# Patient Record
Sex: Female | Born: 1990 | Hispanic: Yes | Marital: Single | Smoking: Never smoker
Health system: Southern US, Community
[De-identification: ages and names within clinical notes are randomized; demographics above are authoritative.]

## PROBLEM LIST (undated history)

## (undated) HISTORY — PX: APPENDECTOMY: SHX54

---

## 2011-10-13 ENCOUNTER — Other Ambulatory Visit: Payer: Self-pay

## 2011-11-10 ENCOUNTER — Other Ambulatory Visit: Payer: Self-pay | Admitting: Obstetrics and Gynecology

## 2011-11-17 ENCOUNTER — Other Ambulatory Visit (HOSPITAL_COMMUNITY): Payer: Self-pay | Admitting: Obstetrics and Gynecology

## 2011-11-17 DIAGNOSIS — O280 Abnormal hematological finding on antenatal screening of mother: Secondary | ICD-10-CM

## 2011-11-23 ENCOUNTER — Ambulatory Visit (HOSPITAL_COMMUNITY)
Admission: RE | Admit: 2011-11-23 | Discharge: 2011-11-23 | Disposition: A | Discharge status: Home / Self Care | Payer: Medicaid Other | Source: Ambulatory Visit | Attending: Obstetrics and Gynecology | Admitting: Obstetrics and Gynecology

## 2011-11-23 ENCOUNTER — Encounter (HOSPITAL_COMMUNITY): Payer: Self-pay

## 2011-11-23 VITALS — BP 110/68 | HR 61 | Wt 158.0 lb

## 2011-11-23 DIAGNOSIS — Z1389 Encounter for screening for other disorder: Secondary | ICD-10-CM | POA: Insufficient documentation

## 2011-11-23 DIAGNOSIS — O289 Unspecified abnormal findings on antenatal screening of mother: Secondary | ICD-10-CM | POA: Insufficient documentation

## 2011-11-23 DIAGNOSIS — O358XX0 Maternal care for other (suspected) fetal abnormality and damage, not applicable or unspecified: Secondary | ICD-10-CM | POA: Insufficient documentation

## 2011-11-23 DIAGNOSIS — O280 Abnormal hematological finding on antenatal screening of mother: Secondary | ICD-10-CM

## 2011-11-23 DIAGNOSIS — Z363 Encounter for antenatal screening for malformations: Secondary | ICD-10-CM | POA: Insufficient documentation

## 2011-11-23 NOTE — Progress Notes (Signed)
Obstetric ultrasound performed today.  Please see report in [facility]. 

## 2011-11-23 NOTE — Progress Notes (Signed)
Genetic Counseling  High-Risk Gestation Note  Appointment Date:  11/23/2011 Referred By: [name], DO Date of Birth:  1991/04/25 Partner:  Sabrina Harrison   Pregnancy History: [id number] Estimated Date of Delivery: 05/02/12 Estimated Gestational Age: [redacted]w[redacted]d   [name] and her partner, Mr. Sabrina Harrison, were seen for genetic counseling because of an increased risk for fetal Down syndrome based on Quad screening through [facility]. The patient provided her own interpreter today.  They were counseled regarding the Quad screen result and the associated 1 in 265 risk for fetal Down syndrome.  We reviewed chromosomes, nondisjunction, and the common features and variable prognosis of Down syndrome.  In addition, we reviewed the screen adjusted reduction in risks for trisomy 18 and ONTDs.  We also discussed other explanations for a screen positive result including: a gestational dating error, differences in maternal metabolism, and normal variation.  We reviewed other available screening and diagnostic options including detailed ultrasound, cell free fetal DNA (cffDNA) testing, and amniocentesis.  We discussed the risks, limitations, and benefits of each.  Specifically, we discussed that cffDNA testing utilizes fetal DNA found in the maternal circulation. This test is not diagnostic for chromosome conditions, but can provide information regarding the presence or absence of extra fetal DNA for chromosomes 13, 18 and 21. The reported detection rate is greater than 99% for Trisomy 21, greater than 97% for Trisomy 18, and is approximately 91% for Trisomy 13. The false positive rate is thought to be less than 1% for any of these conditions. After consideration of all the options, and a clear understanding of the newness and limitations of cffDNA, they elected to proceed with cell free fetal DNA testing. Those results will be available in 8-10 days and will be forwarded to your office when we  receive them. Mrs. Sabrina Harrison will consider the option of amniocentesis pending the results of this screening test.  In addition, a complete ultrasound was performed today.  The ultrasound report will be sent under separate cover.  Ms. Sabrina Harrison was provided with written information regarding sickle cell anemia (SCA) including the carrier frequency and incidence in the Hispanic population, the availability of carrier testing and prenatal diagnosis if indicated.  In addition, we discussed that hemoglobinopathies are routinely screened for as part of the [place] newborn screening panel.  She declined hemoglobin electrophoresis today.   Both family histories were reviewed and found to be noncontributory for birth defects, mental retardation, and known genetic conditions. Without further information regarding the provided family history, an accurate genetic risk cannot be calculated. Further genetic counseling is warranted if more information is obtained.  Ms. Sabrina Harrison denied exposure to environmental toxins or chemical agents. She denied the use of alcohol, tobacco or street drugs. She denied significant viral illnesses during the course of her pregnancy. Her medical and surgical histories were noncontributory.   I counseled this couple for approximately 38 minutes regarding the above risks and available options.     [name], MS,  Patent attorney

## 2011-11-25 ENCOUNTER — Other Ambulatory Visit: Payer: Self-pay

## 2012-01-04 ENCOUNTER — Ambulatory Visit (HOSPITAL_COMMUNITY): Payer: Medicaid Other | Attending: Obstetrics and Gynecology

## 2013-09-27 IMAGING — US US OB DETAIL+14 WK
1 series · 14 of 28 positions shown · non-contrast
Comparison: none

[Series 1: us ob detail+14 wk · 0.20mm/px · 14 of 64 slices shown]
[im 3/64]
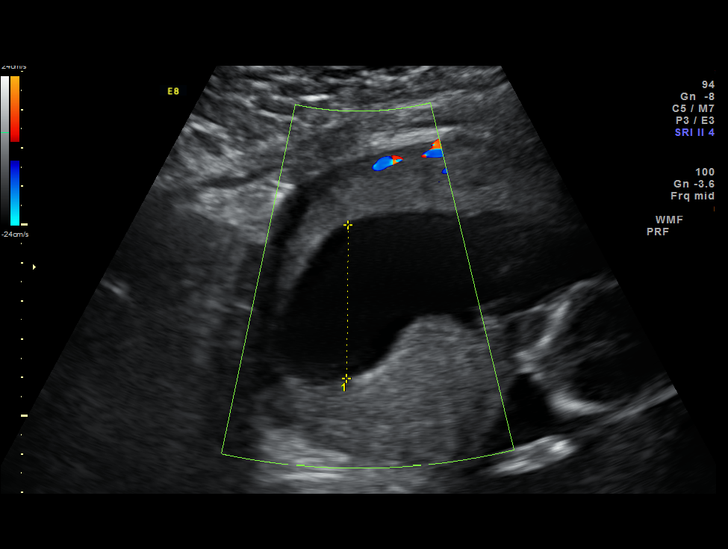
[im 8/64]
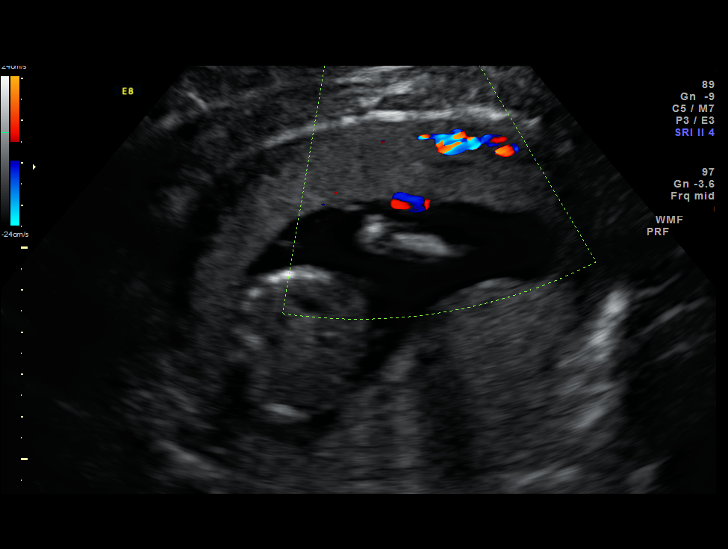
[im 12/64]
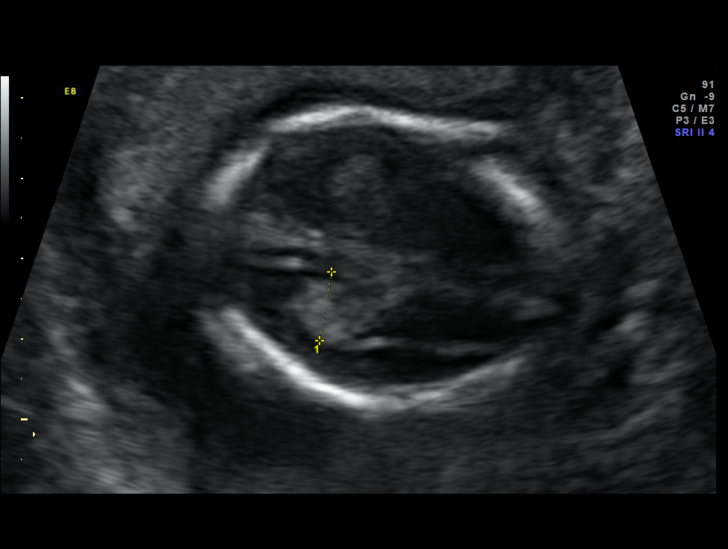
[im 17/64]
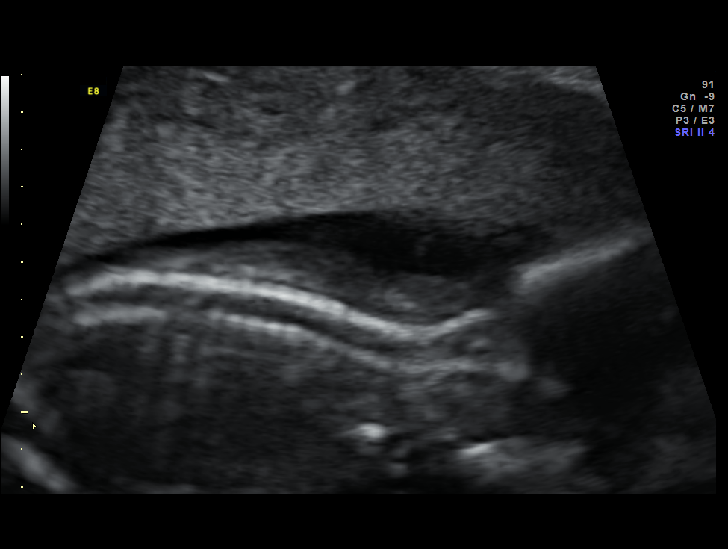
[im 22/64]
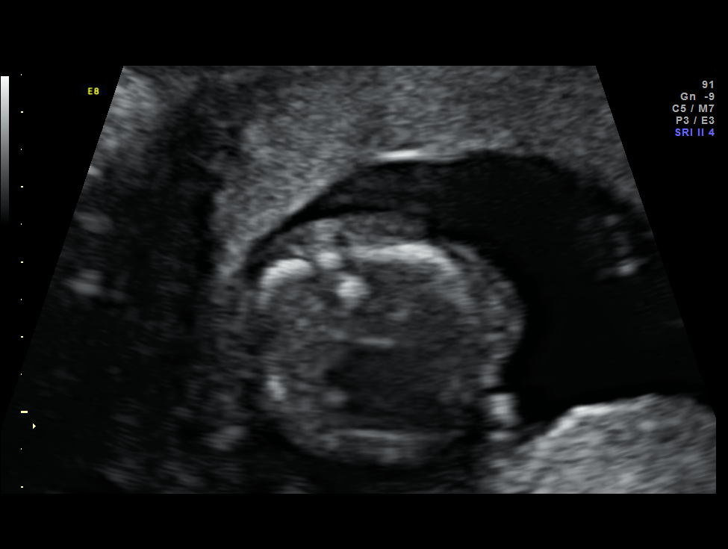
[im 26/64]
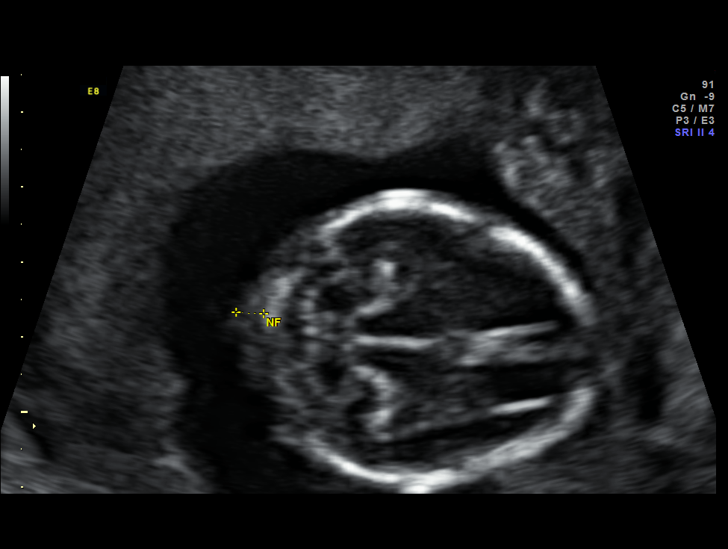
[im 31/64]
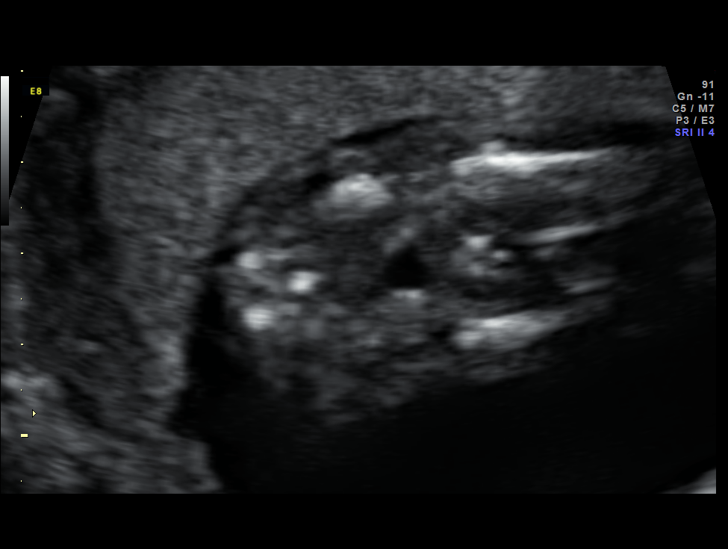
[im 36/64]
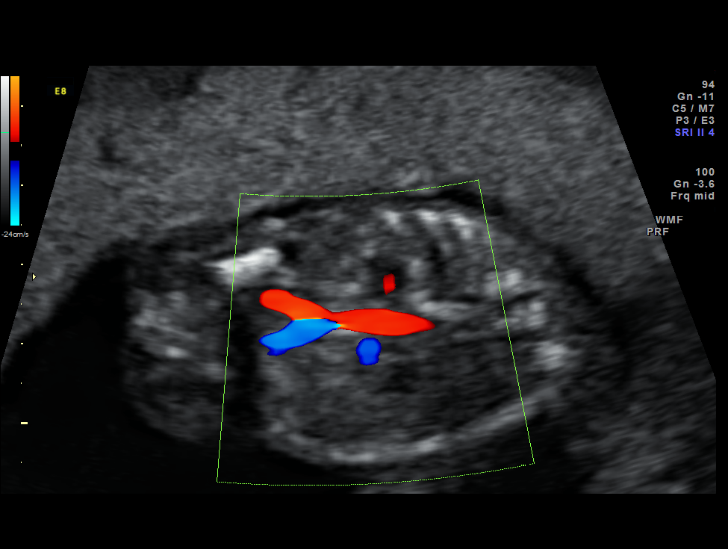
[im 40/64]
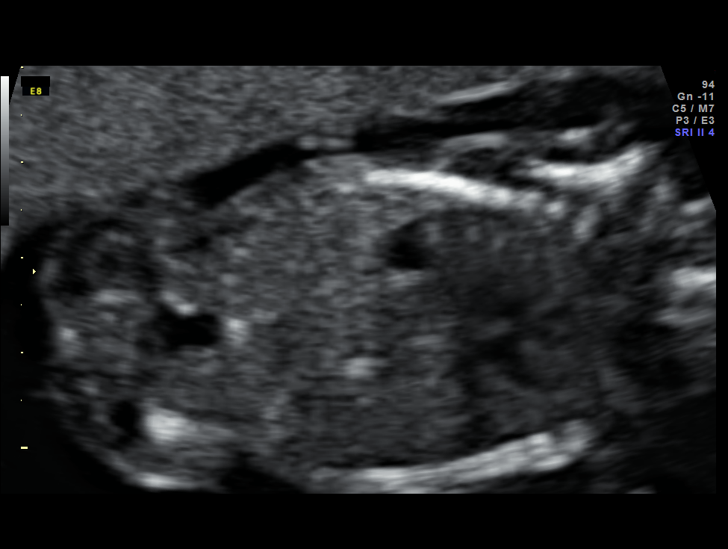
[im 45/64]
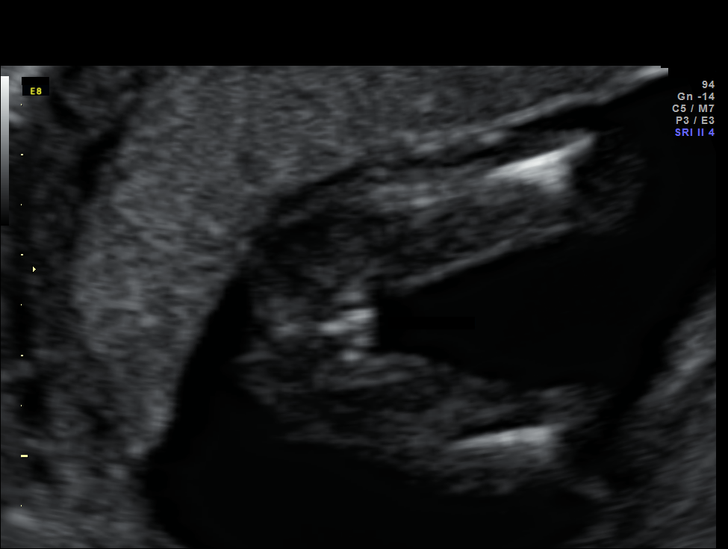
[im 50/64]
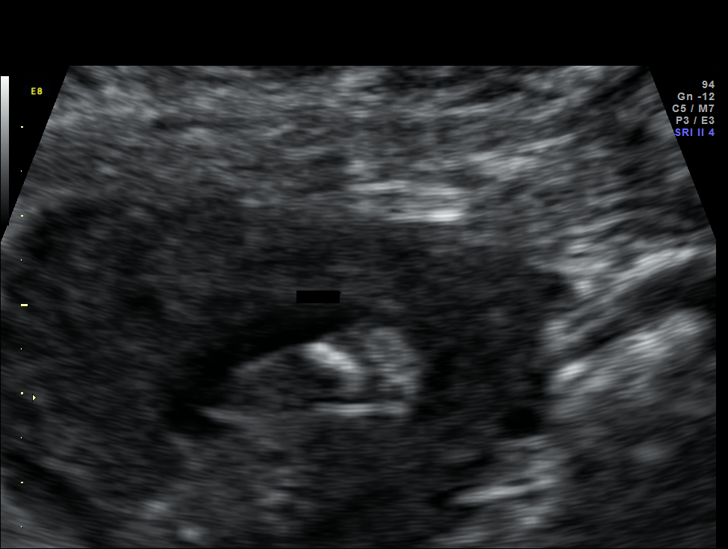
[im 54/64]
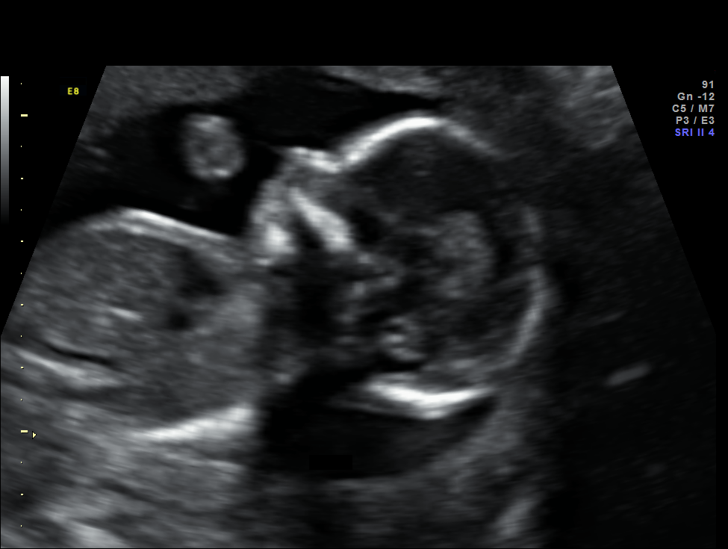
[im 59/64]
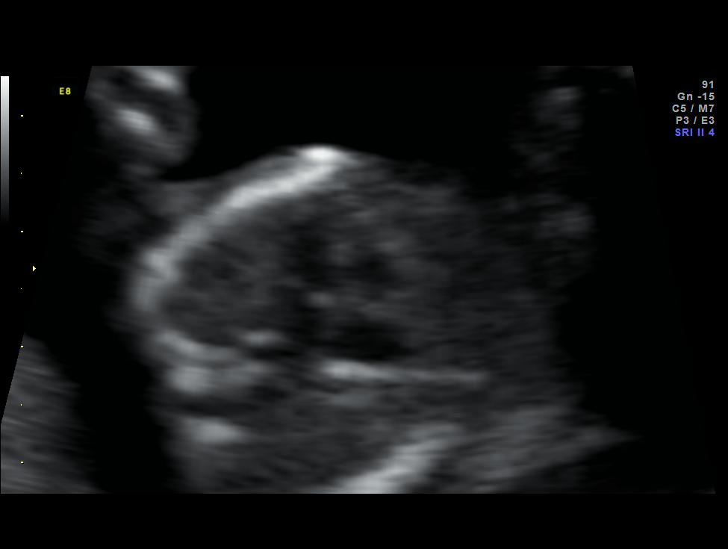
[im 64/64]
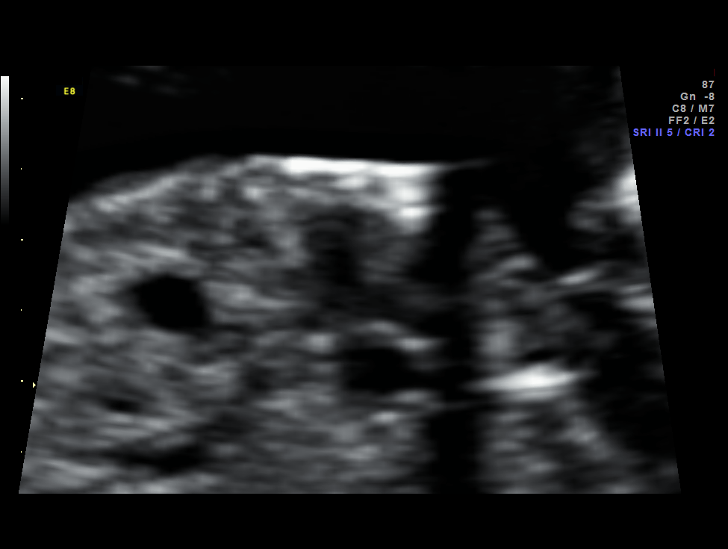

[14 of 28 positions shown; findings below may reference images not displayed]

Canned report from images found in remote index.

Refer to host system for actual result text.

## 2014-09-15 ENCOUNTER — Encounter (HOSPITAL_COMMUNITY): Payer: Self-pay

## 2016-11-16 DIAGNOSIS — I83892 Varicose veins of left lower extremities with other complications: Secondary | ICD-10-CM | POA: Diagnosis not present

## 2016-11-16 DIAGNOSIS — I83812 Varicose veins of left lower extremities with pain: Secondary | ICD-10-CM | POA: Diagnosis not present

## 2016-11-22 DIAGNOSIS — I83812 Varicose veins of left lower extremities with pain: Secondary | ICD-10-CM | POA: Diagnosis not present

## 2016-12-13 DIAGNOSIS — I8312 Varicose veins of left lower extremity with inflammation: Secondary | ICD-10-CM | POA: Diagnosis not present

## 2016-12-13 DIAGNOSIS — I83892 Varicose veins of left lower extremities with other complications: Secondary | ICD-10-CM | POA: Diagnosis not present

## 2016-12-26 DIAGNOSIS — I83812 Varicose veins of left lower extremities with pain: Secondary | ICD-10-CM | POA: Diagnosis not present

## 2016-12-26 DIAGNOSIS — M7981 Nontraumatic hematoma of soft tissue: Secondary | ICD-10-CM | POA: Diagnosis not present

## 2016-12-26 DIAGNOSIS — I83892 Varicose veins of left lower extremities with other complications: Secondary | ICD-10-CM | POA: Diagnosis not present

## 2017-01-16 DIAGNOSIS — I83892 Varicose veins of left lower extremities with other complications: Secondary | ICD-10-CM | POA: Diagnosis not present

## 2017-01-16 DIAGNOSIS — I8312 Varicose veins of left lower extremity with inflammation: Secondary | ICD-10-CM | POA: Diagnosis not present

## 2017-01-31 DIAGNOSIS — I8312 Varicose veins of left lower extremity with inflammation: Secondary | ICD-10-CM | POA: Diagnosis not present

## 2017-01-31 DIAGNOSIS — I8311 Varicose veins of right lower extremity with inflammation: Secondary | ICD-10-CM | POA: Diagnosis not present

## 2017-01-31 DIAGNOSIS — M7981 Nontraumatic hematoma of soft tissue: Secondary | ICD-10-CM | POA: Diagnosis not present

## 2017-03-02 DIAGNOSIS — I8311 Varicose veins of right lower extremity with inflammation: Secondary | ICD-10-CM | POA: Diagnosis not present

## 2017-03-02 DIAGNOSIS — I8312 Varicose veins of left lower extremity with inflammation: Secondary | ICD-10-CM | POA: Diagnosis not present

## 2017-04-05 DIAGNOSIS — I83811 Varicose veins of right lower extremities with pain: Secondary | ICD-10-CM | POA: Diagnosis not present

## 2017-04-05 DIAGNOSIS — I83891 Varicose veins of right lower extremities with other complications: Secondary | ICD-10-CM | POA: Diagnosis not present

## 2017-04-05 DIAGNOSIS — I8311 Varicose veins of right lower extremity with inflammation: Secondary | ICD-10-CM | POA: Diagnosis not present

## 2017-04-07 DIAGNOSIS — I8311 Varicose veins of right lower extremity with inflammation: Secondary | ICD-10-CM | POA: Diagnosis not present

## 2017-04-25 DIAGNOSIS — I8311 Varicose veins of right lower extremity with inflammation: Secondary | ICD-10-CM | POA: Diagnosis not present

## 2017-04-25 DIAGNOSIS — I83811 Varicose veins of right lower extremities with pain: Secondary | ICD-10-CM | POA: Diagnosis not present

## 2017-05-08 DIAGNOSIS — I83891 Varicose veins of right lower extremities with other complications: Secondary | ICD-10-CM | POA: Diagnosis not present

## 2017-05-08 DIAGNOSIS — M7981 Nontraumatic hematoma of soft tissue: Secondary | ICD-10-CM | POA: Diagnosis not present

## 2017-05-08 DIAGNOSIS — I8311 Varicose veins of right lower extremity with inflammation: Secondary | ICD-10-CM | POA: Diagnosis not present

## 2017-05-22 DIAGNOSIS — M7981 Nontraumatic hematoma of soft tissue: Secondary | ICD-10-CM | POA: Diagnosis not present

## 2017-05-22 DIAGNOSIS — I83811 Varicose veins of right lower extremities with pain: Secondary | ICD-10-CM | POA: Diagnosis not present

## 2017-05-22 DIAGNOSIS — I8311 Varicose veins of right lower extremity with inflammation: Secondary | ICD-10-CM | POA: Diagnosis not present

## 2017-06-05 DIAGNOSIS — I8311 Varicose veins of right lower extremity with inflammation: Secondary | ICD-10-CM | POA: Diagnosis not present

## 2017-06-05 DIAGNOSIS — M7981 Nontraumatic hematoma of soft tissue: Secondary | ICD-10-CM | POA: Diagnosis not present

## 2017-11-23 DIAGNOSIS — J0100 Acute maxillary sinusitis, unspecified: Secondary | ICD-10-CM | POA: Diagnosis not present

## 2017-11-23 DIAGNOSIS — R6884 Jaw pain: Secondary | ICD-10-CM | POA: Diagnosis not present

## 2017-11-23 DIAGNOSIS — K0889 Other specified disorders of teeth and supporting structures: Secondary | ICD-10-CM | POA: Diagnosis not present

## 2017-12-19 DIAGNOSIS — R21 Rash and other nonspecific skin eruption: Secondary | ICD-10-CM | POA: Diagnosis not present

## 2018-05-03 DIAGNOSIS — N926 Irregular menstruation, unspecified: Secondary | ICD-10-CM | POA: Diagnosis not present

## 2018-05-03 DIAGNOSIS — L680 Hirsutism: Secondary | ICD-10-CM | POA: Diagnosis not present

## 2018-05-03 DIAGNOSIS — E663 Overweight: Secondary | ICD-10-CM | POA: Diagnosis not present

## 2018-05-10 DIAGNOSIS — L680 Hirsutism: Secondary | ICD-10-CM | POA: Diagnosis not present

## 2018-05-10 DIAGNOSIS — N926 Irregular menstruation, unspecified: Secondary | ICD-10-CM | POA: Diagnosis not present

## 2018-05-10 DIAGNOSIS — R5383 Other fatigue: Secondary | ICD-10-CM | POA: Diagnosis not present

## 2018-05-16 DIAGNOSIS — R7989 Other specified abnormal findings of blood chemistry: Secondary | ICD-10-CM | POA: Diagnosis not present

## 2018-05-23 DIAGNOSIS — Z01419 Encounter for gynecological examination (general) (routine) without abnormal findings: Secondary | ICD-10-CM | POA: Diagnosis not present

## 2018-05-23 DIAGNOSIS — Z0000 Encounter for general adult medical examination without abnormal findings: Secondary | ICD-10-CM | POA: Diagnosis not present

## 2018-05-29 DIAGNOSIS — R7989 Other specified abnormal findings of blood chemistry: Secondary | ICD-10-CM | POA: Diagnosis not present

## 2018-09-17 DIAGNOSIS — Z30012 Encounter for prescription of emergency contraception: Secondary | ICD-10-CM | POA: Diagnosis not present

## 2018-09-17 DIAGNOSIS — Z309 Encounter for contraceptive management, unspecified: Secondary | ICD-10-CM | POA: Diagnosis not present

## 2018-09-17 DIAGNOSIS — Z3041 Encounter for surveillance of contraceptive pills: Secondary | ICD-10-CM | POA: Diagnosis not present

## 2019-02-07 DIAGNOSIS — J301 Allergic rhinitis due to pollen: Secondary | ICD-10-CM | POA: Diagnosis not present

## 2019-04-01 DIAGNOSIS — Z719 Counseling, unspecified: Secondary | ICD-10-CM | POA: Diagnosis not present

## 2019-11-20 ENCOUNTER — Ambulatory Visit
Admission: RE | Admit: 2019-11-20 | Discharge: 2019-11-20 | Disposition: A | Discharge status: Home / Self Care | Payer: No Typology Code available for payment source | Source: Ambulatory Visit | Attending: Nurse Practitioner | Admitting: Nurse Practitioner

## 2019-11-20 ENCOUNTER — Other Ambulatory Visit: Payer: Self-pay

## 2019-11-20 ENCOUNTER — Other Ambulatory Visit: Payer: Self-pay | Admitting: Nurse Practitioner

## 2019-11-20 DIAGNOSIS — Z021 Encounter for pre-employment examination: Secondary | ICD-10-CM

## 2021-07-26 ENCOUNTER — Other Ambulatory Visit: Payer: Self-pay

## 2021-07-26 ENCOUNTER — Telehealth: Payer: Self-pay | Admitting: Allergy and Immunology

## 2021-07-26 ENCOUNTER — Ambulatory Visit: Payer: 59 | Admitting: Allergy and Immunology

## 2021-07-26 ENCOUNTER — Encounter: Payer: Self-pay | Admitting: Allergy and Immunology

## 2021-07-26 VITALS — BP 118/74 | HR 79 | Resp 16 | Ht 63.0 in | Wt 185.6 lb

## 2021-07-26 DIAGNOSIS — J301 Allergic rhinitis due to pollen: Secondary | ICD-10-CM

## 2021-07-26 DIAGNOSIS — J3089 Other allergic rhinitis: Secondary | ICD-10-CM

## 2021-07-26 DIAGNOSIS — H1010 Acute atopic conjunctivitis, unspecified eye: Secondary | ICD-10-CM

## 2021-07-26 DIAGNOSIS — H1013 Acute atopic conjunctivitis, bilateral: Secondary | ICD-10-CM | POA: Diagnosis not present

## 2021-07-26 DIAGNOSIS — L2089 Other atopic dermatitis: Secondary | ICD-10-CM | POA: Diagnosis not present

## 2021-07-26 NOTE — Telephone Encounter (Signed)
Patient is wanting to start allergy injections. Patient contacted her insurance and they informed her that a provider would need to contact them. They mentioned she needed medical review which she wasn't too sure what they meant from that. They won't give her any prices on injections until a provider contacts the insurance directly.

## 2021-07-26 NOTE — Patient Instructions (Addendum)
  1.  Allergen avoidance measures - cat, dog, pollen  2.  Continue to treat and prevent inflammation:  A. Flonase - 1-2 sprays each nostril 3-7 times per week  3. If needed:  A. Xyzal 5 mg - 1 tablet 1 time per day B. Pataday - 1 drop each eye 1 time per day C. Topical steroid  4. Consider a course of immunotherapy  5. Plan for fall flu vaccine  6. Return to clinic in 12 months or earlier if problem

## 2021-07-26 NOTE — Progress Notes (Signed)
[place] - [place] - [place] - [place]   Dear Dr. Yetta Flock,  Thank you for referring Sabrina Harrison to the [facility] [place] on 07/26/2021.   Below is a summation of this patient's evaluation and recommendations.  Thank you for your referral. I will keep you informed about this patient's response to treatment.   If you have any questions please do not hesitate to contact me.   Sincerely,  Jessica Priest, MD Allergy / Immunology [facility]   ______________________________________________________________________    NEW PATIENT NOTE  Referring Provider: Charlott Rakes, MD Primary Provider: Charlott Rakes, MD Date of office visit: 07/26/2021    Subjective:   Chief Complaint:  Sabrina Harrison (DOB: 03-May-1991) is a 30 y.o. female who presents to the clinic on 07/26/2021 with a chief complaint of Allergic Rhinitis  .     HPI: Sabrina Harrison presents to this clinic in evaluation of allergic disease.  She has had a progressive problem over the course of the past several years with sneezing and nasal itching and oral itching and eye watering and facial itching and throat itching.  Initially this was only occurring during the spring but it has since become a perennial problem over the course of the past year or so.  Certainly pollen exposure gives rise to these issues but now she has triggers that cannot be identified.  She does use a nasal steroid and a antihistamine which helps the symptoms but she still remains symptomatic.  She apparently also has developed an intermittent pruritic red dermatitis involving her neck that flares up during the summer mostly 1 or 2 times per year for which she will use a prescription cream for about 1 week which results in resolution of this issue.  Sometimes this involves her elbows.  Sometimes when she eats fresh fruit such as a  watermelon she will get some itching in her mouth but she still continues to eat these foods.  She has never developed an associated systemic reaction.  History reviewed. No pertinent past medical history.  Past Surgical History:  Procedure Laterality Date   APPENDECTOMY      Allergies as of 07/26/2021   No Known Allergies      Medication List    PRENATAL VITAMINS PO Take by mouth.    Review of systems negative except as noted in HPI / PMHx or noted below:  Review of Systems  Constitutional: Negative.   HENT: Negative.    Eyes: Negative.   Respiratory: Negative.    Cardiovascular: Negative.   Gastrointestinal: Negative.   Genitourinary: Negative.   Musculoskeletal: Negative.   Skin: Negative.   Neurological: Negative.   Endo/Heme/Allergies: Negative.   Psychiatric/Behavioral: Negative.     Family History  Problem Relation Age of Onset   Allergic rhinitis Mother     Social History   Socioeconomic History   Marital status: Single    Spouse name: Not on file   Number of children: Not on file   Years of education: Not on file   Highest education level: Not on file  Occupational History   Not on file  Tobacco Use   Smoking status: Never   Smokeless tobacco: Not on file  Substance and Sexual Activity   Alcohol use: No   Drug use: No   Sexual activity: Not on file  Other Topics Concern   Not on file  Social History  Narrative   Not on file   Environmental and Social history  Lives in a house with a dry environment, dog look inside the household, carpet in the bedroom, no plastic on the bed, no plastic on the pillow, no smoking ongoing inside the household.  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  Objective:   Vitals:   07/26/21 1002  BP: 118/74  Pulse: 79  Resp: 16  SpO2: 98%   Height: 5\' 3"  (160 cm) Weight: 185 lb 9.6 oz (84.2 kg)  Physical Exam Constitutional:      Appearance: She is not diaphoretic.  HENT:     Head:  Normocephalic.     Right Ear: Tympanic membrane, ear canal and external ear normal.     Left Ear: Tympanic membrane, ear canal and external ear normal.     Nose: Nose normal. No mucosal edema or rhinorrhea.     Mouth/Throat:     Pharynx: Uvula midline. No oropharyngeal exudate.  Eyes:     Conjunctiva/sclera: Conjunctivae normal.  Neck:     Thyroid: No thyromegaly.     Trachea: Trachea normal. No tracheal tenderness or tracheal deviation.  Cardiovascular:     Rate and Rhythm: Normal rate and regular rhythm.     Heart sounds: Normal heart sounds, S1 normal and S2 normal. No murmur heard. Pulmonary:     Effort: No respiratory distress.     Breath sounds: Normal breath sounds. No stridor. No wheezing or rales.  Lymphadenopathy:     Head:     Right side of head: No tonsillar adenopathy.     Left side of head: No tonsillar adenopathy.     Cervical: No cervical adenopathy.  Skin:    Findings: No erythema or rash.     Nails: There is no clubbing.  Neurological:     Mental Status: She is alert.    Diagnostics: Allergy skin tests were performed.  She demonstrated significant hypersensitivity against tree pollen, grass pollen, weed pollen, cat, and dog  Assessment and Plan:    1. Perennial allergic rhinitis   2. Seasonal allergic rhinitis due to pollen   3. Perennial allergic conjunctivitis of both eyes   4. Seasonal allergic conjunctivitis   5. Other atopic dermatitis     1.  Allergen avoidance measures - cat, dog, pollen  2.  Continue to treat and prevent inflammation:  A. Flonase - 1-2 sprays each nostril 3-7 times per week  3. If needed:  A. Xyzal 5 mg - 1 tablet 1 time per day B. Pataday - 1 drop each eye 1 time per day C. Topical steroid  4. Consider a course of immunotherapy  5. Plan for fall flu vaccine  6. Return to clinic in 12 months or earlier if problem  Sabrina Harrison appears to have a atopic immune system and she will address this issue by attempting to perform  allergen avoidance measures as best as possible and continuing on some anti-inflammatory medications for her airway and skin as noted above.  She would definitely be a candidate for a course of immunotherapy and I have given her some literature on this form of treatment during today's visit.  She is presently considering this option.  I will see her back in this clinic in 1 year or earlier if there is a problem.   Bertha Stakes, MD Allergy / Immunology [facility] [place]

## 2021-07-27 ENCOUNTER — Encounter: Payer: Self-pay | Admitting: Allergy and Immunology

## 2021-07-28 NOTE — Telephone Encounter (Signed)
Hi Candice,   I informed patient and also provided her with vial prices. She states she will call her insurance again and will give the office a call back if she has any questions.   Thank you!

## 2021-08-18 ENCOUNTER — Telehealth (INDEPENDENT_AMBULATORY_CARE_PROVIDER_SITE_OTHER): Payer: 59 | Admitting: Allergy and Immunology

## 2021-08-18 DIAGNOSIS — J309 Allergic rhinitis, unspecified: Secondary | ICD-10-CM

## 2021-08-18 NOTE — Telephone Encounter (Signed)
Patient states her insurance will not let her start allergy injections or even give her a price for the injections without receiving a PA. I informed patient that a PA is not needed to start allergy injections but patient states insurance is requiring one. Patient is requesting for a nurse to give her insurance a call and see if they are able to give more information. Patient would like a call back as soon as possible as she has been waiting weeks to start injections.

## 2021-08-19 NOTE — Telephone Encounter (Signed)
I called her insurance company and gave them the CPT codes 12248 and [phone]- they stated it is a $50 copay for the administration each time and that the pt has a $3000 out of pocket deductible. They could not tell me what the coverage was for the vials.

## 2021-08-23 NOTE — Telephone Encounter (Addendum)
Spoke with a different rep at [facility] reference number for call the [phone]) they stated that with both cpt codes there is a $50 copay and what is remaining is 100% covered. I told her that I was not sure how accurate this info was and that she needed to call insurance with the reference number so that they could explain what was explained to me- to her as well. Pt was informed and she wanted to go ahead and schedule new start appointment.

## 2021-09-14 ENCOUNTER — Ambulatory Visit: Payer: 59

## 2021-09-16 ENCOUNTER — Other Ambulatory Visit: Payer: Self-pay | Admitting: Allergy and Immunology

## 2021-09-16 DIAGNOSIS — J3089 Other allergic rhinitis: Secondary | ICD-10-CM

## 2021-09-16 DIAGNOSIS — J301 Allergic rhinitis due to pollen: Secondary | ICD-10-CM

## 2021-09-16 DIAGNOSIS — J3081 Allergic rhinitis due to animal (cat) (dog) hair and dander: Secondary | ICD-10-CM

## 2021-09-16 NOTE — Progress Notes (Signed)
Aeroallergen Immunotherapy   Ordering Provider: Dr. Laurette Schimke   Patient Details  Name: Sabrina Harrison  MRN: [id number]  Date of Birth: 12-Aug-1991   Order two of two   Vial Label: weed   0.3 ml (Volume)  1:20 Concentration -- Ragweed Mix  0.5 ml (Volume)  1:20 Concentration -- Weed Mix*    0.8  ml Extract Subtotal  4.2  ml Diluent  5.0  ml Maintenance Total   Schedule:  B   Blue Vial (1:100,000): Schedule B (6 doses)  Yellow Vial (1:10,000): Schedule B (6 doses)  Green Vial (1:1,000): Schedule B (6 doses)  Red Vial (1:100): Schedule A (12 doses)   Special Instructions: 1-2 times per week

## 2021-09-16 NOTE — Progress Notes (Signed)
Aeroallergen Immunotherapy   Ordering Provider: Dr. Laurette Schimke   Patient Details  Name: Sabrina Harrison  MRN: [id number]  Date of Birth: 30-May-1991   Order one of one   Vial Label: Grass, tree, cat, dog   0.3 ml (Volume)  BAU Concentration -- 7 Grass Mix* 100,000 ([address] [place], [place], [place], Perennial [place], RedTop, Sweet Vernal, Timothy)  0.1 ml (Volume)  BAU Concentration -- [place] 10,000  0.5 ml (Volume)  1:20 Concentration -- Eastern 10 Tree Mix (also Sweet Gum)  0.1 ml (Volume)  1:10 Concentration -- Hickory*  0.1 ml (Volume)  1:10 Concentration -- Oak, [place] mix*  0.1 ml (Volume)  1:20 Concentration -- Walnut, Black Pollen  0.3 ml (Volume)  1:10 Concentration -- Cat Hair  0.3 ml (Volume)  1:10 Concentration -- Dog Epithelia    1.8  ml Extract Subtotal  3.2  ml Diluent  5.0  ml Maintenance Total   Schedule:  B   Blue Vial (1:100,000): Schedule B (6 doses)  Yellow Vial (1:10,000): Schedule B (6 doses)  Green Vial (1:1,000): Schedule B (6 doses)  Red Vial (1:100): Schedule A (12 doses)   Special Instructions: 1-2 times per week

## 2021-09-16 NOTE — Progress Notes (Signed)
VIALS MADE. EXP 09-16-22 

## 2021-09-16 NOTE — Progress Notes (Unsigned)
   [place] - [place] - [place] - [place] - [place]   Follow-up Note  Referring Provider: No ref. provider found Primary Provider: Charlott Rakes, MD Date of Office Visit: 09/16/2021  Subjective:   Sabrina Harrison (DOB: Dec 27, 1990) is a 30 y.o. female who [facility] the [facility] on 09/16/2021 in re-evaluation of the following:  HPI:   Allergies as of 09/16/2021   No Known Allergies      Medication List        Accurate as of September 16, 2021  6:41 AM. If you have any questions, ask your nurse or doctor.          fluticasone 50 MCG/ACT nasal spray Commonly known as: FLONASE Place into both nostrils.   levocetirizine 5 MG tablet Commonly known as: XYZAL Take 5 mg by mouth daily.        No past medical history on file.  Past Surgical History:  Procedure Laterality Date   APPENDECTOMY      Review of systems negative except as noted in HPI / PMHx or noted below:  ROS   Objective:   There were no vitals filed for this visit.        Physical Exam  Diagnostics:    Spirometry was performed and demonstrated an FEV1 of *** at *** % of predicted.  The patient had an Asthma Control Test with the following results:  .    Assessment and Plan:   No diagnosis found.  There are no Patient Instructions on file for this visit.  Laurette Schimke, MD Allergy / Immunology [facility]

## 2021-09-24 IMAGING — CR DG CHEST 1V
1 series · 1 of 1 positions shown · non-contrast
Comparison: None.

CLINICAL DATA: Pre-employment examination

EXAM:
CHEST  1 VIEW

[w chest pa]
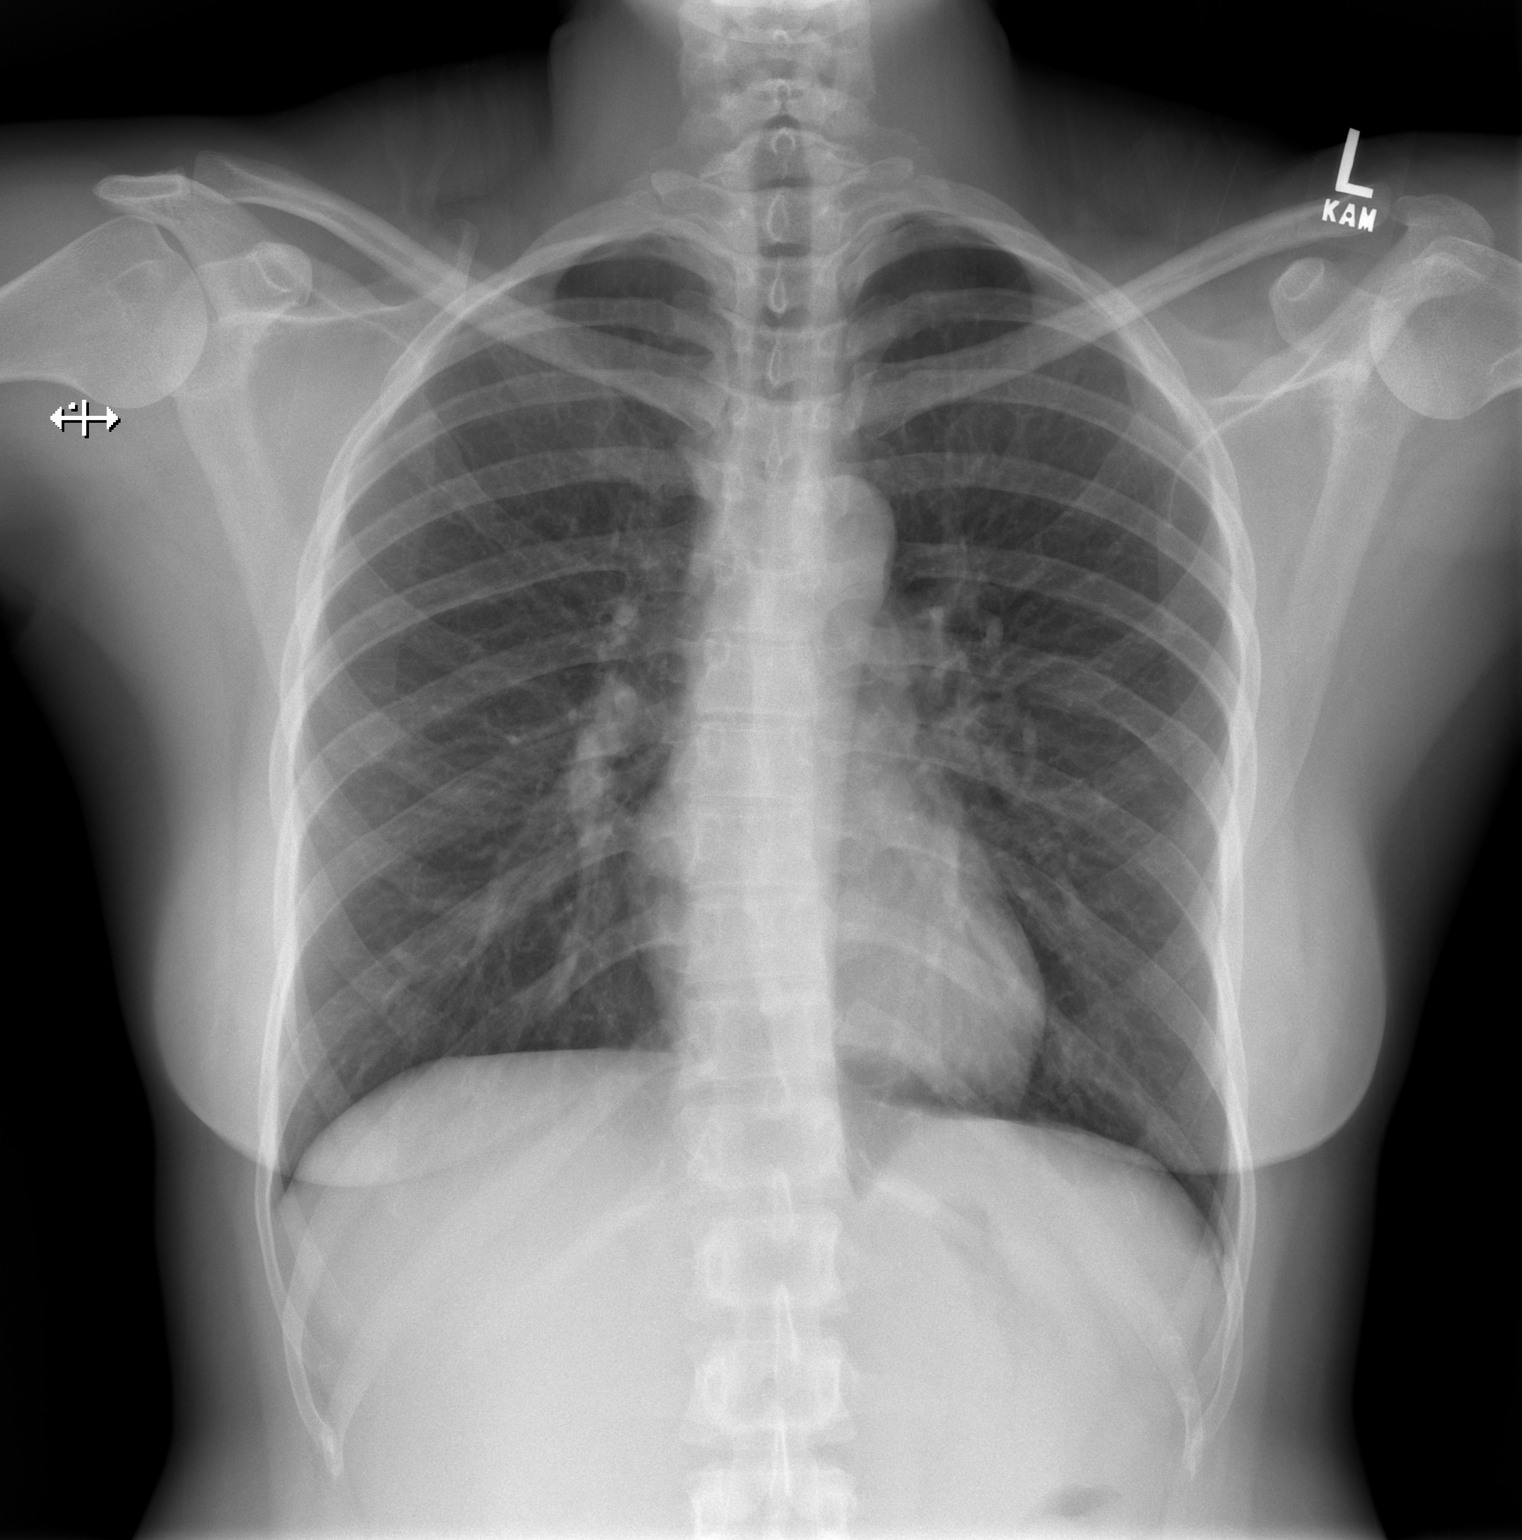

[1 of 1 positions shown; findings below may reference images not displayed]

FINDINGS: Lungs are clear. Heart size and pulmonary vascularity are normal. No
adenopathy. No bone lesions.
IMPRESSION: No abnormality noted.

## 2021-09-27 ENCOUNTER — Ambulatory Visit (INDEPENDENT_AMBULATORY_CARE_PROVIDER_SITE_OTHER): Payer: 59 | Admitting: *Deleted

## 2021-09-27 ENCOUNTER — Other Ambulatory Visit: Payer: Self-pay

## 2021-09-27 DIAGNOSIS — J309 Allergic rhinitis, unspecified: Secondary | ICD-10-CM | POA: Diagnosis not present

## 2021-09-27 MED ORDER — EPINEPHRINE 0.3 MG/0.3ML IJ SOAJ: INTRAMUSCULAR | 3 refills | Status: DC

## 2021-09-27 NOTE — Progress Notes (Signed)
Immunotherapy   Patient Details  Name: Sabrina Harrison MRN: [id number] Date of Birth: [birth year]  09/27/2021  [name] started injections for  WEED-G/T/C/D Following schedule: B  Frequency:2 times per week Epi-Pen:Prescription for Epi-Pen given Consent signed and patient instructions given.   [name] 09/27/2021, 9:22 AM

## 2021-09-29 ENCOUNTER — Ambulatory Visit (INDEPENDENT_AMBULATORY_CARE_PROVIDER_SITE_OTHER): Payer: 59 | Admitting: *Deleted

## 2021-09-29 DIAGNOSIS — J309 Allergic rhinitis, unspecified: Secondary | ICD-10-CM

## 2021-10-04 ENCOUNTER — Ambulatory Visit (INDEPENDENT_AMBULATORY_CARE_PROVIDER_SITE_OTHER): Payer: 59

## 2021-10-04 DIAGNOSIS — J309 Allergic rhinitis, unspecified: Secondary | ICD-10-CM | POA: Diagnosis not present

## 2021-10-06 ENCOUNTER — Ambulatory Visit (INDEPENDENT_AMBULATORY_CARE_PROVIDER_SITE_OTHER): Payer: 59 | Admitting: *Deleted

## 2021-10-06 DIAGNOSIS — J309 Allergic rhinitis, unspecified: Secondary | ICD-10-CM | POA: Diagnosis not present

## 2021-10-11 ENCOUNTER — Ambulatory Visit (INDEPENDENT_AMBULATORY_CARE_PROVIDER_SITE_OTHER): Payer: 59 | Admitting: *Deleted

## 2021-10-11 DIAGNOSIS — J309 Allergic rhinitis, unspecified: Secondary | ICD-10-CM

## 2021-10-13 ENCOUNTER — Ambulatory Visit (INDEPENDENT_AMBULATORY_CARE_PROVIDER_SITE_OTHER): Payer: 59 | Admitting: *Deleted

## 2021-10-13 DIAGNOSIS — J309 Allergic rhinitis, unspecified: Secondary | ICD-10-CM

## 2021-10-14 DIAGNOSIS — J302 Other seasonal allergic rhinitis: Secondary | ICD-10-CM

## 2021-10-18 ENCOUNTER — Ambulatory Visit (INDEPENDENT_AMBULATORY_CARE_PROVIDER_SITE_OTHER): Payer: 59 | Admitting: *Deleted

## 2021-10-18 DIAGNOSIS — J309 Allergic rhinitis, unspecified: Secondary | ICD-10-CM | POA: Diagnosis not present

## 2021-10-20 ENCOUNTER — Ambulatory Visit (INDEPENDENT_AMBULATORY_CARE_PROVIDER_SITE_OTHER): Payer: 59

## 2021-10-20 DIAGNOSIS — J309 Allergic rhinitis, unspecified: Secondary | ICD-10-CM

## 2021-10-25 ENCOUNTER — Ambulatory Visit (INDEPENDENT_AMBULATORY_CARE_PROVIDER_SITE_OTHER): Payer: 59 | Admitting: *Deleted

## 2021-10-25 DIAGNOSIS — J309 Allergic rhinitis, unspecified: Secondary | ICD-10-CM | POA: Diagnosis not present

## 2021-10-27 ENCOUNTER — Ambulatory Visit (INDEPENDENT_AMBULATORY_CARE_PROVIDER_SITE_OTHER): Payer: 59 | Admitting: *Deleted

## 2021-10-27 DIAGNOSIS — J309 Allergic rhinitis, unspecified: Secondary | ICD-10-CM | POA: Diagnosis not present

## 2021-11-01 ENCOUNTER — Ambulatory Visit (INDEPENDENT_AMBULATORY_CARE_PROVIDER_SITE_OTHER): Payer: 59 | Admitting: *Deleted

## 2021-11-01 DIAGNOSIS — J309 Allergic rhinitis, unspecified: Secondary | ICD-10-CM

## 2021-11-04 ENCOUNTER — Ambulatory Visit (INDEPENDENT_AMBULATORY_CARE_PROVIDER_SITE_OTHER): Payer: 59 | Admitting: *Deleted

## 2021-11-04 DIAGNOSIS — J309 Allergic rhinitis, unspecified: Secondary | ICD-10-CM

## 2021-11-16 ENCOUNTER — Ambulatory Visit (INDEPENDENT_AMBULATORY_CARE_PROVIDER_SITE_OTHER): Payer: 59 | Admitting: *Deleted

## 2021-11-16 DIAGNOSIS — J309 Allergic rhinitis, unspecified: Secondary | ICD-10-CM

## 2021-11-18 ENCOUNTER — Ambulatory Visit (INDEPENDENT_AMBULATORY_CARE_PROVIDER_SITE_OTHER): Payer: 59 | Admitting: *Deleted

## 2021-11-18 DIAGNOSIS — J309 Allergic rhinitis, unspecified: Secondary | ICD-10-CM | POA: Diagnosis not present

## 2021-11-22 ENCOUNTER — Ambulatory Visit (INDEPENDENT_AMBULATORY_CARE_PROVIDER_SITE_OTHER): Payer: 59 | Admitting: *Deleted

## 2021-11-22 DIAGNOSIS — J309 Allergic rhinitis, unspecified: Secondary | ICD-10-CM

## 2021-11-24 ENCOUNTER — Ambulatory Visit (INDEPENDENT_AMBULATORY_CARE_PROVIDER_SITE_OTHER): Payer: 59

## 2021-11-24 DIAGNOSIS — J309 Allergic rhinitis, unspecified: Secondary | ICD-10-CM

## 2021-11-29 ENCOUNTER — Ambulatory Visit (INDEPENDENT_AMBULATORY_CARE_PROVIDER_SITE_OTHER): Payer: 59 | Admitting: *Deleted

## 2021-11-29 DIAGNOSIS — J309 Allergic rhinitis, unspecified: Secondary | ICD-10-CM

## 2021-12-01 ENCOUNTER — Ambulatory Visit (INDEPENDENT_AMBULATORY_CARE_PROVIDER_SITE_OTHER): Payer: 59 | Admitting: *Deleted

## 2021-12-01 DIAGNOSIS — J309 Allergic rhinitis, unspecified: Secondary | ICD-10-CM | POA: Diagnosis not present

## 2021-12-06 ENCOUNTER — Ambulatory Visit (INDEPENDENT_AMBULATORY_CARE_PROVIDER_SITE_OTHER): Payer: 59 | Admitting: *Deleted

## 2021-12-06 DIAGNOSIS — J309 Allergic rhinitis, unspecified: Secondary | ICD-10-CM

## 2021-12-13 ENCOUNTER — Ambulatory Visit (INDEPENDENT_AMBULATORY_CARE_PROVIDER_SITE_OTHER): Payer: 59 | Admitting: *Deleted

## 2021-12-13 DIAGNOSIS — J309 Allergic rhinitis, unspecified: Secondary | ICD-10-CM

## 2021-12-20 ENCOUNTER — Ambulatory Visit (INDEPENDENT_AMBULATORY_CARE_PROVIDER_SITE_OTHER): Payer: 59 | Admitting: *Deleted

## 2021-12-20 DIAGNOSIS — J309 Allergic rhinitis, unspecified: Secondary | ICD-10-CM | POA: Diagnosis not present

## 2021-12-27 ENCOUNTER — Ambulatory Visit (INDEPENDENT_AMBULATORY_CARE_PROVIDER_SITE_OTHER): Payer: 59 | Admitting: *Deleted

## 2021-12-27 DIAGNOSIS — J309 Allergic rhinitis, unspecified: Secondary | ICD-10-CM

## 2022-01-03 ENCOUNTER — Ambulatory Visit (INDEPENDENT_AMBULATORY_CARE_PROVIDER_SITE_OTHER): Payer: 59 | Admitting: *Deleted

## 2022-01-03 DIAGNOSIS — J309 Allergic rhinitis, unspecified: Secondary | ICD-10-CM | POA: Diagnosis not present

## 2022-01-10 ENCOUNTER — Ambulatory Visit (INDEPENDENT_AMBULATORY_CARE_PROVIDER_SITE_OTHER): Payer: 59 | Admitting: *Deleted

## 2022-01-10 DIAGNOSIS — J309 Allergic rhinitis, unspecified: Secondary | ICD-10-CM | POA: Diagnosis not present

## 2022-01-17 ENCOUNTER — Ambulatory Visit (INDEPENDENT_AMBULATORY_CARE_PROVIDER_SITE_OTHER): Payer: 59 | Admitting: *Deleted

## 2022-01-17 DIAGNOSIS — J309 Allergic rhinitis, unspecified: Secondary | ICD-10-CM

## 2022-01-26 ENCOUNTER — Ambulatory Visit (INDEPENDENT_AMBULATORY_CARE_PROVIDER_SITE_OTHER): Payer: 59 | Admitting: *Deleted

## 2022-01-26 DIAGNOSIS — J309 Allergic rhinitis, unspecified: Secondary | ICD-10-CM

## 2022-02-02 ENCOUNTER — Ambulatory Visit (INDEPENDENT_AMBULATORY_CARE_PROVIDER_SITE_OTHER): Payer: 59 | Admitting: *Deleted

## 2022-02-02 DIAGNOSIS — J309 Allergic rhinitis, unspecified: Secondary | ICD-10-CM | POA: Diagnosis not present

## 2022-02-10 ENCOUNTER — Ambulatory Visit (INDEPENDENT_AMBULATORY_CARE_PROVIDER_SITE_OTHER): Payer: 59 | Admitting: *Deleted

## 2022-02-10 DIAGNOSIS — J309 Allergic rhinitis, unspecified: Secondary | ICD-10-CM

## 2022-02-16 ENCOUNTER — Ambulatory Visit (INDEPENDENT_AMBULATORY_CARE_PROVIDER_SITE_OTHER): Payer: 59 | Admitting: *Deleted

## 2022-02-16 DIAGNOSIS — J309 Allergic rhinitis, unspecified: Secondary | ICD-10-CM | POA: Diagnosis not present

## 2022-02-17 DIAGNOSIS — J3081 Allergic rhinitis due to animal (cat) (dog) hair and dander: Secondary | ICD-10-CM | POA: Diagnosis not present

## 2022-02-17 NOTE — Progress Notes (Signed)
VIAL EXP 02-18-23 ?

## 2022-02-18 DIAGNOSIS — J302 Other seasonal allergic rhinitis: Secondary | ICD-10-CM | POA: Diagnosis not present

## 2022-02-28 ENCOUNTER — Ambulatory Visit (INDEPENDENT_AMBULATORY_CARE_PROVIDER_SITE_OTHER): Payer: 59 | Admitting: *Deleted

## 2022-02-28 DIAGNOSIS — J309 Allergic rhinitis, unspecified: Secondary | ICD-10-CM | POA: Diagnosis not present

## 2022-03-15 ENCOUNTER — Ambulatory Visit (INDEPENDENT_AMBULATORY_CARE_PROVIDER_SITE_OTHER): Payer: 59 | Admitting: *Deleted

## 2022-03-15 DIAGNOSIS — J309 Allergic rhinitis, unspecified: Secondary | ICD-10-CM | POA: Diagnosis not present

## 2022-03-21 ENCOUNTER — Ambulatory Visit (INDEPENDENT_AMBULATORY_CARE_PROVIDER_SITE_OTHER): Payer: 59 | Admitting: *Deleted

## 2022-03-21 DIAGNOSIS — J309 Allergic rhinitis, unspecified: Secondary | ICD-10-CM | POA: Diagnosis not present

## 2022-03-29 ENCOUNTER — Ambulatory Visit (INDEPENDENT_AMBULATORY_CARE_PROVIDER_SITE_OTHER): Payer: 59 | Admitting: *Deleted

## 2022-03-29 DIAGNOSIS — J309 Allergic rhinitis, unspecified: Secondary | ICD-10-CM | POA: Diagnosis not present

## 2022-04-04 ENCOUNTER — Ambulatory Visit (INDEPENDENT_AMBULATORY_CARE_PROVIDER_SITE_OTHER): Payer: 59

## 2022-04-04 DIAGNOSIS — J309 Allergic rhinitis, unspecified: Secondary | ICD-10-CM

## 2022-04-12 ENCOUNTER — Ambulatory Visit (INDEPENDENT_AMBULATORY_CARE_PROVIDER_SITE_OTHER): Payer: 59 | Admitting: *Deleted

## 2022-04-12 DIAGNOSIS — J309 Allergic rhinitis, unspecified: Secondary | ICD-10-CM

## 2022-04-18 ENCOUNTER — Ambulatory Visit (INDEPENDENT_AMBULATORY_CARE_PROVIDER_SITE_OTHER): Payer: 59 | Admitting: *Deleted

## 2022-04-18 DIAGNOSIS — J309 Allergic rhinitis, unspecified: Secondary | ICD-10-CM | POA: Diagnosis not present

## 2022-04-27 ENCOUNTER — Ambulatory Visit (INDEPENDENT_AMBULATORY_CARE_PROVIDER_SITE_OTHER): Payer: 59 | Admitting: *Deleted

## 2022-04-27 DIAGNOSIS — J309 Allergic rhinitis, unspecified: Secondary | ICD-10-CM | POA: Diagnosis not present

## 2022-05-02 ENCOUNTER — Ambulatory Visit (INDEPENDENT_AMBULATORY_CARE_PROVIDER_SITE_OTHER): Payer: 59 | Admitting: *Deleted

## 2022-05-02 DIAGNOSIS — J309 Allergic rhinitis, unspecified: Secondary | ICD-10-CM

## 2022-05-04 DIAGNOSIS — J301 Allergic rhinitis due to pollen: Secondary | ICD-10-CM | POA: Diagnosis not present

## 2022-05-04 NOTE — Progress Notes (Signed)
VIALS EXP 05-05-23 

## 2022-05-12 ENCOUNTER — Ambulatory Visit (INDEPENDENT_AMBULATORY_CARE_PROVIDER_SITE_OTHER): Payer: 59 | Admitting: *Deleted

## 2022-05-12 DIAGNOSIS — J309 Allergic rhinitis, unspecified: Secondary | ICD-10-CM

## 2022-05-19 DIAGNOSIS — J3081 Allergic rhinitis due to animal (cat) (dog) hair and dander: Secondary | ICD-10-CM | POA: Diagnosis not present

## 2022-05-23 ENCOUNTER — Ambulatory Visit (INDEPENDENT_AMBULATORY_CARE_PROVIDER_SITE_OTHER): Payer: 59 | Admitting: *Deleted

## 2022-05-23 DIAGNOSIS — J309 Allergic rhinitis, unspecified: Secondary | ICD-10-CM

## 2022-06-01 ENCOUNTER — Ambulatory Visit (INDEPENDENT_AMBULATORY_CARE_PROVIDER_SITE_OTHER): Payer: 59 | Admitting: *Deleted

## 2022-06-01 DIAGNOSIS — J309 Allergic rhinitis, unspecified: Secondary | ICD-10-CM | POA: Diagnosis not present

## 2022-06-08 ENCOUNTER — Ambulatory Visit (INDEPENDENT_AMBULATORY_CARE_PROVIDER_SITE_OTHER): Payer: 59 | Admitting: *Deleted

## 2022-06-08 DIAGNOSIS — J309 Allergic rhinitis, unspecified: Secondary | ICD-10-CM | POA: Diagnosis not present

## 2022-06-14 ENCOUNTER — Ambulatory Visit (INDEPENDENT_AMBULATORY_CARE_PROVIDER_SITE_OTHER): Payer: 59 | Admitting: *Deleted

## 2022-06-14 DIAGNOSIS — J309 Allergic rhinitis, unspecified: Secondary | ICD-10-CM | POA: Diagnosis not present

## 2022-06-20 ENCOUNTER — Ambulatory Visit (INDEPENDENT_AMBULATORY_CARE_PROVIDER_SITE_OTHER): Payer: 59 | Admitting: *Deleted

## 2022-06-20 DIAGNOSIS — J309 Allergic rhinitis, unspecified: Secondary | ICD-10-CM | POA: Diagnosis not present

## 2022-06-27 ENCOUNTER — Ambulatory Visit (INDEPENDENT_AMBULATORY_CARE_PROVIDER_SITE_OTHER): Payer: 59 | Admitting: *Deleted

## 2022-06-27 DIAGNOSIS — J309 Allergic rhinitis, unspecified: Secondary | ICD-10-CM

## 2022-07-04 ENCOUNTER — Ambulatory Visit (INDEPENDENT_AMBULATORY_CARE_PROVIDER_SITE_OTHER): Payer: 59 | Admitting: *Deleted

## 2022-07-04 DIAGNOSIS — J309 Allergic rhinitis, unspecified: Secondary | ICD-10-CM | POA: Diagnosis not present

## 2022-07-21 ENCOUNTER — Ambulatory Visit (INDEPENDENT_AMBULATORY_CARE_PROVIDER_SITE_OTHER): Payer: 59 | Admitting: *Deleted

## 2022-07-21 DIAGNOSIS — J309 Allergic rhinitis, unspecified: Secondary | ICD-10-CM | POA: Diagnosis not present

## 2022-08-02 ENCOUNTER — Ambulatory Visit (INDEPENDENT_AMBULATORY_CARE_PROVIDER_SITE_OTHER): Payer: 59

## 2022-08-02 DIAGNOSIS — J309 Allergic rhinitis, unspecified: Secondary | ICD-10-CM

## 2022-08-18 ENCOUNTER — Ambulatory Visit (INDEPENDENT_AMBULATORY_CARE_PROVIDER_SITE_OTHER): Payer: 59 | Admitting: *Deleted

## 2022-08-18 DIAGNOSIS — J309 Allergic rhinitis, unspecified: Secondary | ICD-10-CM | POA: Diagnosis not present

## 2022-08-24 DIAGNOSIS — J3081 Allergic rhinitis due to animal (cat) (dog) hair and dander: Secondary | ICD-10-CM | POA: Diagnosis not present

## 2022-08-24 NOTE — Progress Notes (Signed)
VIALS EXP 08-25-23 

## 2022-09-01 ENCOUNTER — Ambulatory Visit (INDEPENDENT_AMBULATORY_CARE_PROVIDER_SITE_OTHER): Payer: 59 | Admitting: *Deleted

## 2022-09-01 DIAGNOSIS — J309 Allergic rhinitis, unspecified: Secondary | ICD-10-CM | POA: Diagnosis not present

## 2022-09-13 ENCOUNTER — Ambulatory Visit (INDEPENDENT_AMBULATORY_CARE_PROVIDER_SITE_OTHER): Payer: 59

## 2022-09-13 DIAGNOSIS — J309 Allergic rhinitis, unspecified: Secondary | ICD-10-CM | POA: Diagnosis not present

## 2022-09-14 DIAGNOSIS — J301 Allergic rhinitis due to pollen: Secondary | ICD-10-CM | POA: Diagnosis not present

## 2022-09-26 ENCOUNTER — Ambulatory Visit (INDEPENDENT_AMBULATORY_CARE_PROVIDER_SITE_OTHER): Payer: 59 | Admitting: *Deleted

## 2022-09-26 DIAGNOSIS — J309 Allergic rhinitis, unspecified: Secondary | ICD-10-CM | POA: Diagnosis not present

## 2022-10-12 ENCOUNTER — Ambulatory Visit (INDEPENDENT_AMBULATORY_CARE_PROVIDER_SITE_OTHER): Payer: 59 | Admitting: *Deleted

## 2022-10-12 DIAGNOSIS — J309 Allergic rhinitis, unspecified: Secondary | ICD-10-CM | POA: Diagnosis not present

## 2022-10-19 ENCOUNTER — Ambulatory Visit (INDEPENDENT_AMBULATORY_CARE_PROVIDER_SITE_OTHER): Payer: 59 | Admitting: *Deleted

## 2022-10-19 DIAGNOSIS — J309 Allergic rhinitis, unspecified: Secondary | ICD-10-CM

## 2022-10-26 ENCOUNTER — Ambulatory Visit (INDEPENDENT_AMBULATORY_CARE_PROVIDER_SITE_OTHER): Payer: 59

## 2022-10-26 DIAGNOSIS — J309 Allergic rhinitis, unspecified: Secondary | ICD-10-CM

## 2022-11-02 ENCOUNTER — Ambulatory Visit (INDEPENDENT_AMBULATORY_CARE_PROVIDER_SITE_OTHER): Payer: 59 | Admitting: *Deleted

## 2022-11-02 DIAGNOSIS — J309 Allergic rhinitis, unspecified: Secondary | ICD-10-CM | POA: Diagnosis not present

## 2022-11-16 ENCOUNTER — Ambulatory Visit (INDEPENDENT_AMBULATORY_CARE_PROVIDER_SITE_OTHER): Payer: 59 | Admitting: *Deleted

## 2022-11-16 DIAGNOSIS — J309 Allergic rhinitis, unspecified: Secondary | ICD-10-CM | POA: Diagnosis not present

## 2022-11-23 ENCOUNTER — Ambulatory Visit (INDEPENDENT_AMBULATORY_CARE_PROVIDER_SITE_OTHER): Payer: 59 | Admitting: *Deleted

## 2022-11-23 DIAGNOSIS — J309 Allergic rhinitis, unspecified: Secondary | ICD-10-CM | POA: Diagnosis not present

## 2022-12-05 ENCOUNTER — Ambulatory Visit (INDEPENDENT_AMBULATORY_CARE_PROVIDER_SITE_OTHER): Payer: 59 | Admitting: *Deleted

## 2022-12-05 DIAGNOSIS — J309 Allergic rhinitis, unspecified: Secondary | ICD-10-CM

## 2022-12-14 ENCOUNTER — Ambulatory Visit (INDEPENDENT_AMBULATORY_CARE_PROVIDER_SITE_OTHER): Payer: 59 | Admitting: *Deleted

## 2022-12-14 DIAGNOSIS — J309 Allergic rhinitis, unspecified: Secondary | ICD-10-CM

## 2022-12-21 NOTE — Progress Notes (Signed)
VIALS EXP 12-22-23 

## 2022-12-22 DIAGNOSIS — J301 Allergic rhinitis due to pollen: Secondary | ICD-10-CM | POA: Diagnosis not present

## 2023-01-02 DIAGNOSIS — J3081 Allergic rhinitis due to animal (cat) (dog) hair and dander: Secondary | ICD-10-CM | POA: Diagnosis not present

## 2023-01-05 ENCOUNTER — Ambulatory Visit (INDEPENDENT_AMBULATORY_CARE_PROVIDER_SITE_OTHER): Payer: 59 | Admitting: *Deleted

## 2023-01-05 DIAGNOSIS — J309 Allergic rhinitis, unspecified: Secondary | ICD-10-CM | POA: Diagnosis not present

## 2023-01-19 ENCOUNTER — Ambulatory Visit (INDEPENDENT_AMBULATORY_CARE_PROVIDER_SITE_OTHER): Payer: 59 | Admitting: *Deleted

## 2023-01-19 DIAGNOSIS — J309 Allergic rhinitis, unspecified: Secondary | ICD-10-CM

## 2023-02-06 ENCOUNTER — Ambulatory Visit (INDEPENDENT_AMBULATORY_CARE_PROVIDER_SITE_OTHER): Payer: 59 | Admitting: *Deleted

## 2023-02-06 DIAGNOSIS — J309 Allergic rhinitis, unspecified: Secondary | ICD-10-CM | POA: Diagnosis not present

## 2023-02-21 ENCOUNTER — Ambulatory Visit (INDEPENDENT_AMBULATORY_CARE_PROVIDER_SITE_OTHER): Payer: 59 | Admitting: *Deleted

## 2023-02-21 DIAGNOSIS — J309 Allergic rhinitis, unspecified: Secondary | ICD-10-CM

## 2023-02-27 ENCOUNTER — Ambulatory Visit (INDEPENDENT_AMBULATORY_CARE_PROVIDER_SITE_OTHER): Payer: 59 | Admitting: *Deleted

## 2023-02-27 DIAGNOSIS — J309 Allergic rhinitis, unspecified: Secondary | ICD-10-CM

## 2023-03-09 ENCOUNTER — Ambulatory Visit (INDEPENDENT_AMBULATORY_CARE_PROVIDER_SITE_OTHER): Payer: 59 | Admitting: *Deleted

## 2023-03-09 DIAGNOSIS — J309 Allergic rhinitis, unspecified: Secondary | ICD-10-CM | POA: Diagnosis not present

## 2023-03-13 ENCOUNTER — Ambulatory Visit (INDEPENDENT_AMBULATORY_CARE_PROVIDER_SITE_OTHER): Payer: 59 | Admitting: *Deleted

## 2023-03-13 DIAGNOSIS — J309 Allergic rhinitis, unspecified: Secondary | ICD-10-CM | POA: Diagnosis not present

## 2023-03-20 ENCOUNTER — Ambulatory Visit (INDEPENDENT_AMBULATORY_CARE_PROVIDER_SITE_OTHER): Payer: 59 | Admitting: *Deleted

## 2023-03-20 DIAGNOSIS — J309 Allergic rhinitis, unspecified: Secondary | ICD-10-CM | POA: Diagnosis not present

## 2023-04-03 ENCOUNTER — Ambulatory Visit (INDEPENDENT_AMBULATORY_CARE_PROVIDER_SITE_OTHER): Payer: 59 | Admitting: *Deleted

## 2023-04-03 DIAGNOSIS — J309 Allergic rhinitis, unspecified: Secondary | ICD-10-CM | POA: Diagnosis not present

## 2023-04-18 ENCOUNTER — Ambulatory Visit (INDEPENDENT_AMBULATORY_CARE_PROVIDER_SITE_OTHER): Payer: 59

## 2023-04-18 DIAGNOSIS — J309 Allergic rhinitis, unspecified: Secondary | ICD-10-CM | POA: Diagnosis not present

## 2023-05-11 ENCOUNTER — Ambulatory Visit (INDEPENDENT_AMBULATORY_CARE_PROVIDER_SITE_OTHER): Payer: 59 | Admitting: *Deleted

## 2023-05-11 DIAGNOSIS — J309 Allergic rhinitis, unspecified: Secondary | ICD-10-CM | POA: Diagnosis not present

## 2023-05-23 DIAGNOSIS — J3081 Allergic rhinitis due to animal (cat) (dog) hair and dander: Secondary | ICD-10-CM | POA: Diagnosis not present

## 2023-05-23 NOTE — Progress Notes (Signed)
VIALS EXP 05-22-24

## 2023-05-25 ENCOUNTER — Ambulatory Visit (INDEPENDENT_AMBULATORY_CARE_PROVIDER_SITE_OTHER): Payer: 59 | Admitting: *Deleted

## 2023-05-25 DIAGNOSIS — J309 Allergic rhinitis, unspecified: Secondary | ICD-10-CM | POA: Diagnosis not present

## 2023-06-05 ENCOUNTER — Telehealth: Payer: Self-pay | Admitting: *Deleted

## 2023-06-05 ENCOUNTER — Ambulatory Visit (INDEPENDENT_AMBULATORY_CARE_PROVIDER_SITE_OTHER): Payer: 59 | Admitting: *Deleted

## 2023-06-05 DIAGNOSIS — J309 Allergic rhinitis, unspecified: Secondary | ICD-10-CM

## 2023-06-05 NOTE — Telephone Encounter (Signed)
Sabrina Harrison has moved and will be transferring her immunotherapy to our [facility] location. I am sending vials with Dr. Marlynn Perking this Friday (07/26) and she will take them to [facility] the following Monday.

## 2023-06-08 DIAGNOSIS — J301 Allergic rhinitis due to pollen: Secondary | ICD-10-CM | POA: Diagnosis not present

## 2023-06-09 NOTE — Telephone Encounter (Signed)
Vials given to Dr. Marlynn Perking in [place] to transport to [facility] - 07/26

## 2023-06-21 ENCOUNTER — Ambulatory Visit (INDEPENDENT_AMBULATORY_CARE_PROVIDER_SITE_OTHER): Payer: 59

## 2023-06-21 DIAGNOSIS — J309 Allergic rhinitis, unspecified: Secondary | ICD-10-CM | POA: Diagnosis not present

## 2023-07-07 ENCOUNTER — Ambulatory Visit (INDEPENDENT_AMBULATORY_CARE_PROVIDER_SITE_OTHER): Payer: 59

## 2023-07-07 DIAGNOSIS — J309 Allergic rhinitis, unspecified: Secondary | ICD-10-CM

## 2023-07-08 NOTE — Patient Instructions (Incomplete)
  1.  Allergen avoidance measures - cat, dog, pollen  2.  Continue to treat and prevent inflammation:  A. Flonase - 1-2 sprays each nostril 3-7 times per week  3. If needed:  A. Xyzal 5 mg - 1 tablet 1 time per day B. Pataday - 1 drop each eye 1 time per day C. Topical steroid  4. Continue allergy injections per protocol and have access to epinephrine auto injector device.  5. Plan for fall flu vaccine  6. Return to clinic in 12 months or earlier if problem

## 2023-07-10 ENCOUNTER — Encounter: Payer: Self-pay | Admitting: Family

## 2023-07-10 ENCOUNTER — Ambulatory Visit: Payer: 59 | Admitting: Family

## 2023-07-10 ENCOUNTER — Other Ambulatory Visit: Payer: Self-pay

## 2023-07-10 VITALS — BP 124/70 | HR 78 | Temp 98.6°F | Resp 20 | Wt 181.8 lb

## 2023-07-10 DIAGNOSIS — J301 Allergic rhinitis due to pollen: Secondary | ICD-10-CM | POA: Diagnosis not present

## 2023-07-10 DIAGNOSIS — J3089 Other allergic rhinitis: Secondary | ICD-10-CM

## 2023-07-10 DIAGNOSIS — H1013 Acute atopic conjunctivitis, bilateral: Secondary | ICD-10-CM | POA: Diagnosis not present

## 2023-07-10 DIAGNOSIS — L2089 Other atopic dermatitis: Secondary | ICD-10-CM

## 2023-07-10 DIAGNOSIS — H1010 Acute atopic conjunctivitis, unspecified eye: Secondary | ICD-10-CM

## 2023-07-10 MED ORDER — FEXOFENADINE HCL 180 MG PO TABS: 180.0000 mg | ORAL_TABLET | Freq: Every day | ORAL | 5 refills | Status: DC

## 2023-07-10 MED ORDER — OLOPATADINE HCL 0.2 % OP SOLN: OPHTHALMIC | 5 refills | Status: AC

## 2023-07-10 MED ORDER — EPINEPHRINE 0.3 MG/0.3ML IJ SOAJ: INTRAMUSCULAR | 1 refills | Status: AC

## 2023-07-10 NOTE — Progress Notes (Signed)
[address] [place]  [address] Dept: [phone]  FOLLOW UP NOTE  Patient ID: Sabrina Harrison, female    DOB: 04/26/1991  Age: 32 y.o. MRN: [id number] Date of Office Visit: 07/10/2023  Assessment  Chief Complaint: Follow-up  HPI Sabrina Harrison is a 32 year old female who presents today for follow-up of seasonal and perennial allergic rhinitis, seasonal and perennial allergic allergic conjunctivitis, and atopic dermatitis.  She was last seen on July 26, 2021 by Dr. Lucie Leather.  She denies any new diagnosis or surgery since her last office visit.  Allergic rhinitis: She is currently taking Xyzal 5 mg daily and uses fluticasone nasal spray as needed.  She reports if she does not take her Xyzal daily she will have a itchy nose and throat.  She denies rhinorrhea, nasal congestion, and postnasal drip lately.  She does feel like her allergy injections help.  She denies any large local reactions at the injection site.  She does need refill on her EpiPen.  She has not been treated for any sinus infections since we last saw her.  She has tried Zyrtec in the past and reports that it does not do much for her.  Allergic conjunctivitis: She reports itchy watery eyes sometimes.  She does not have any eyedrops to use for itchy watery eyes.  She does not wear contacts.  Atopic dermatitis: She reports that she has not had issues with her skin in a while.   Drug Allergies:  No Known Allergies  Review of Systems: Negative except as per HPI   Physical Exam: BP 124/70 (BP Location: Left Arm, Patient Position: Sitting, Cuff Size: Normal)   Pulse 78   Temp 98.6 F (37 C) (Temporal)   Resp 20   Wt 181 lb 12.8 oz (82.5 kg)   SpO2 100%   BMI 32.20 kg/m    Physical Exam Constitutional:      Appearance: Normal appearance.  HENT:     Head: Normocephalic and atraumatic.     Comments: Pharynx normal, eyes normal, ears normal, nose: Bilateral lower turbinates mildly edematous  and slightly erythematous with no drainage noted    Right Ear: Tympanic membrane, ear canal and external ear normal.     Left Ear: Tympanic membrane, ear canal and external ear normal.     Mouth/Throat:     Mouth: Mucous membranes are moist.     Pharynx: Oropharynx is clear.  Eyes:     Conjunctiva/sclera: Conjunctivae normal.  Cardiovascular:     Rate and Rhythm: Regular rhythm.     Heart sounds: Normal heart sounds.  Pulmonary:     Effort: Pulmonary effort is normal.     Breath sounds: Normal breath sounds.     Comments: Lungs clear to auscultation Musculoskeletal:     Cervical back: Neck supple.  Skin:    General: Skin is warm.     Comments: No rashes, eczematous lesions, or urticarial lesions noted  Neurological:     Mental Status: She is alert and oriented to person, place, and time.  Psychiatric:        Mood and Affect: Mood normal.        Behavior: Behavior normal.        Thought Content: Thought content normal.        Judgment: Judgment normal.     Diagnostics:  none  Assessment and Plan: 1. Seasonal allergic rhinitis due to pollen   2. Perennial allergic rhinitis   3. Seasonal allergic  conjunctivitis   4. Perennial allergic conjunctivitis of both eyes   5. Other atopic dermatitis     Meds ordered this encounter  Medications   fexofenadine (ALLEGRA) 180 MG tablet    Sig: Take 1 tablet (180 mg total) by mouth daily.    Dispense:  30 tablet    Refill:  5   Olopatadine HCl 0.2 % SOLN    Sig: Place 1 drop in each eye once a day as needed for itchy watery eyes    Dispense:  2.5 mL    Refill:  5   EPINEPHrine 0.3 mg/0.3 mL IJ SOAJ injection    Sig: Use as directed for life threatening allergic reactions    Dispense:  2 each    Refill:  1    Patient Instructions   1.  Allergen avoidance measures - cat, dog, pollen  2.  Continue to treat and prevent inflammation:  A. Flonase - 1-2 sprays each nostril 3-7 times per week  3. If needed:  A. Allegra  (fexofenadine) - 1 tablet 1 time per day. Stop Xyzal (levocetirizine) B. Pataday (olopatadine) - 1 drop each eye 1 time per day C. Topical steroid  4. Continue allergy injections per protocol and have access to epinephrine auto injector device.Refill sent You can now change the frequency of your allergy injections to every 3 weeks Consider re-skin testing to environmental allergies  to see if you are allergic to anything else.  5. Plan for fall flu vaccine  6. Return to clinic in 12 months or earlier if problem  Return in about 1 year (around 07/09/2024), or if symptoms worsen or fail to improve.    Thank you for the opportunity to care for this patient.  Please do not hesitate to contact me with questions.  Nehemiah Settle, FNP [facility] [place]

## 2023-07-31 ENCOUNTER — Ambulatory Visit (INDEPENDENT_AMBULATORY_CARE_PROVIDER_SITE_OTHER): Payer: 59

## 2023-07-31 DIAGNOSIS — J309 Allergic rhinitis, unspecified: Secondary | ICD-10-CM | POA: Diagnosis not present

## 2023-08-08 ENCOUNTER — Ambulatory Visit (INDEPENDENT_AMBULATORY_CARE_PROVIDER_SITE_OTHER): Payer: 59

## 2023-08-08 DIAGNOSIS — J309 Allergic rhinitis, unspecified: Secondary | ICD-10-CM | POA: Diagnosis not present

## 2023-09-05 ENCOUNTER — Ambulatory Visit (INDEPENDENT_AMBULATORY_CARE_PROVIDER_SITE_OTHER): Payer: 59

## 2023-09-05 DIAGNOSIS — J309 Allergic rhinitis, unspecified: Secondary | ICD-10-CM | POA: Diagnosis not present

## 2023-09-12 ENCOUNTER — Ambulatory Visit (INDEPENDENT_AMBULATORY_CARE_PROVIDER_SITE_OTHER): Payer: 59 | Admitting: *Deleted

## 2023-09-12 DIAGNOSIS — J309 Allergic rhinitis, unspecified: Secondary | ICD-10-CM | POA: Diagnosis not present

## 2023-09-27 ENCOUNTER — Ambulatory Visit (INDEPENDENT_AMBULATORY_CARE_PROVIDER_SITE_OTHER): Payer: 59

## 2023-09-27 DIAGNOSIS — J309 Allergic rhinitis, unspecified: Secondary | ICD-10-CM | POA: Diagnosis not present

## 2023-10-03 ENCOUNTER — Ambulatory Visit (INDEPENDENT_AMBULATORY_CARE_PROVIDER_SITE_OTHER): Payer: 59

## 2023-10-03 DIAGNOSIS — J309 Allergic rhinitis, unspecified: Secondary | ICD-10-CM | POA: Diagnosis not present

## 2023-10-20 ENCOUNTER — Ambulatory Visit (INDEPENDENT_AMBULATORY_CARE_PROVIDER_SITE_OTHER): Payer: 59

## 2023-10-20 DIAGNOSIS — J309 Allergic rhinitis, unspecified: Secondary | ICD-10-CM | POA: Diagnosis not present

## 2023-10-27 ENCOUNTER — Ambulatory Visit (INDEPENDENT_AMBULATORY_CARE_PROVIDER_SITE_OTHER): Payer: 59 | Admitting: *Deleted

## 2023-10-27 DIAGNOSIS — J309 Allergic rhinitis, unspecified: Secondary | ICD-10-CM

## 2023-11-03 DIAGNOSIS — J3081 Allergic rhinitis due to animal (cat) (dog) hair and dander: Secondary | ICD-10-CM | POA: Diagnosis not present

## 2023-11-03 NOTE — Progress Notes (Signed)
EXP 11/02/24

## 2023-11-24 ENCOUNTER — Ambulatory Visit (INDEPENDENT_AMBULATORY_CARE_PROVIDER_SITE_OTHER): Payer: 59 | Admitting: *Deleted

## 2023-11-24 DIAGNOSIS — J309 Allergic rhinitis, unspecified: Secondary | ICD-10-CM | POA: Diagnosis not present

## 2023-12-15 ENCOUNTER — Ambulatory Visit (INDEPENDENT_AMBULATORY_CARE_PROVIDER_SITE_OTHER): Payer: 59

## 2023-12-15 DIAGNOSIS — J309 Allergic rhinitis, unspecified: Secondary | ICD-10-CM | POA: Diagnosis not present

## 2024-01-16 ENCOUNTER — Ambulatory Visit (INDEPENDENT_AMBULATORY_CARE_PROVIDER_SITE_OTHER)

## 2024-01-16 DIAGNOSIS — J309 Allergic rhinitis, unspecified: Secondary | ICD-10-CM

## 2024-01-23 ENCOUNTER — Ambulatory Visit (INDEPENDENT_AMBULATORY_CARE_PROVIDER_SITE_OTHER)

## 2024-01-23 DIAGNOSIS — J309 Allergic rhinitis, unspecified: Secondary | ICD-10-CM

## 2024-01-31 ENCOUNTER — Ambulatory Visit (INDEPENDENT_AMBULATORY_CARE_PROVIDER_SITE_OTHER)

## 2024-01-31 DIAGNOSIS — J309 Allergic rhinitis, unspecified: Secondary | ICD-10-CM | POA: Diagnosis not present

## 2024-02-27 ENCOUNTER — Ambulatory Visit (INDEPENDENT_AMBULATORY_CARE_PROVIDER_SITE_OTHER): Payer: Self-pay

## 2024-02-27 DIAGNOSIS — J309 Allergic rhinitis, unspecified: Secondary | ICD-10-CM | POA: Diagnosis not present

## 2024-03-12 ENCOUNTER — Ambulatory Visit: Admitting: Internal Medicine

## 2024-03-12 ENCOUNTER — Encounter: Payer: Self-pay | Admitting: Internal Medicine

## 2024-03-12 VITALS — BP 110/72 | HR 69 | Temp 98.9°F | Resp 24 | Wt 180.0 lb

## 2024-03-12 DIAGNOSIS — H1013 Acute atopic conjunctivitis, bilateral: Secondary | ICD-10-CM | POA: Diagnosis not present

## 2024-03-12 DIAGNOSIS — L923 Foreign body granuloma of the skin and subcutaneous tissue: Secondary | ICD-10-CM | POA: Diagnosis not present

## 2024-03-12 DIAGNOSIS — J302 Other seasonal allergic rhinitis: Secondary | ICD-10-CM | POA: Diagnosis not present

## 2024-03-12 DIAGNOSIS — J3089 Other allergic rhinitis: Secondary | ICD-10-CM

## 2024-03-12 MED ORDER — IPRATROPIUM BROMIDE 0.06 % NA SOLN: 2.0000 | Freq: Four times a day (QID) | NASAL | 12 refills | Status: AC

## 2024-03-12 MED ORDER — FLUTICASONE PROPIONATE 50 MCG/ACT NA SUSP: 2.0000 | Freq: Every day | NASAL | 5 refills | Status: AC

## 2024-03-12 MED ORDER — AZELASTINE HCL 0.1 % NA SOLN: 2.0000 | Freq: Two times a day (BID) | NASAL | 12 refills | Status: AC

## 2024-03-12 MED ORDER — FEXOFENADINE HCL 180 MG PO TABS: 180.0000 mg | ORAL_TABLET | Freq: Every day | ORAL | 5 refills | Status: AC

## 2024-03-12 NOTE — Patient Instructions (Addendum)
  1.  Allergen avoidance measures - cat, dog, pollen  2.  Continue to treat and prevent inflammation:  A. Flonase - 1-2 sprays each nostril daily   3. If needed:  A. Increase levocetirizine to 5mg  twice daily as needed  B. Pataday  (olopatadine ) - 1 drop each eye 1 time per day C. Astelin 1 spray per nostril twice daily as needed for sneezing D. Atrovent 1 spray per nostril 4 times a day as needed for drainage   4. Continue allergy injections per protocol and have access to epinephrine  auto injector device.Refill sent You can now change the frequency of your allergy injections to every 3 weeks Consider re-skin testing to environmental allergies  to see if you are allergic to anything else.  5. 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 Start Triamcinolone 0.1% ointment twice daily for rash  C. Get CXR, labs to evaluate for sarcoidosis   6. Return to clinic in 12 months or earlier if problem

## 2024-03-12 NOTE — Progress Notes (Signed)
 FOLLOW UP Date of Service/Encounter:  03/12/24  Subjective:  Sabrina Harrison (DOB: March 01, 1991) is a 33 y.o. female who returns to [facility] on 03/12/2024 in re-evaluation of Sabrina following: rhinoconjunctivitis on AIT History obtained from: chart review and patient.  For Review, LV was on 06/20/23  with Tinnie Forehand, FNP seen for routine follow-up. See below for summary of history and diagnostics.   Today presents for follow-up. Discussed Sabrina use of AI scribe software for clinical note transcription with Sabrina patient, who gave verbal consent to proceed.  History of Present Illness Sabrina Harrison is a 33 year old female who presents with worsening allergy symptoms.  She has been experiencing worsening allergy symptoms since early March, including an itchy throat, frequent sneezing, and a sensation of being unwell. Her current medication, Xyzal (levocetirizine), taken in Sabrina morning, is no longer effective, though it does not cause significant drowsiness. She uses eye drops for itchy eyes, which provide temporary relief, and has previously used a nasal spray with minimal benefit. She describes a runny nose, particularly at night, necessitating frequent nose blowing. Despite ongoing allergy injections, her symptoms persist. No cough, wheeze, shortness of breath, or joint aches and pains are present.  Additionally, she has developed a rash with small bumps along Sabrina lines of her tattoo, noticed shortly after contacting [facility] clinic. Sabrina rash extends beyond Sabrina tattoo lines and feels like 'little bumps' that are palpable but not visible. She has a history of sensitive skin and similar rashes that resolved with minimal treatment. Hydrocortisone cream has provided some relief.     All medications reviewed by clinical staff and updated in chart. No new pertinent medical or surgical history except as noted in HPI.  ROS: All others negative except as noted per HPI.    Objective:  BP 110/72 (BP Location: Left Arm, Patient Position: Sitting, Cuff Size: Large)   Pulse 69   Temp 98.9 F (37.2 C) (Oral)   Resp (!) 24   Wt 180 lb (81.6 kg)   SpO2 98%   BMI 31.89 kg/m  Body mass index is 31.89 kg/m. Physical Exam: General Appearance:  Alert, cooperative, no distress, appears stated age  Head:  Normocephalic, without obvious abnormality, atraumatic  Eyes:  Conjunctiva clear, EOM's intact  Ears EACs normal bilaterally  Nose: Nares normal,  Pink mucosa with mild edema, clear rhinnorrhea , no visible anterior polyps, and septum midline  Throat: Lips, tongue normal; teeth and gums normal, normal posterior oropharynx  Neck: Supple, symmetrical  Lungs:   clear to auscultation bilaterally, Respirations unlabored, no coughing  Heart:  regular rate and rhythm and no murmur, Appears well perfused  Extremities: No edema  Skin: Dark colored bumps along lines of tattoo on right arm   Neurologic: No gross deficits   Labs:  Lab Orders  No laboratory test(s) ordered today    Assessment/Plan  Overall mild flare in rhinoconjunctivitis symptoms due to high levels of pollen in Sabrina spring.  Will increase levocetirizine and restart Astelin  and Atrovent .  Continue Pataday .  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  Will start topical steroids and do chest x-ray and labs to start workup.    1.  Allergen avoidance measures - cat, dog, pollen  2.  Continue to treat and prevent inflammation:  A. Flonase  - 1-2 sprays each nostril daily   3. If needed:  A. Increase levocetirizine to 5mg  twice daily as needed  B. Pataday  (olopatadine ) -  1 drop each eye 1 time per day C. Astelin  1 spray per nostril twice daily as needed for sneezing D. Atrovent  1 spray per nostril 4 times a day as needed for drainage   4. Continue allergy injections per protocol and have access to epinephrine  auto injector device.Refill sent You can now change Sabrina frequency of  your allergy injections to every 3 weeks Consider re-skin testing to environmental allergies  to see if you are allergic to anything else.  5. 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 Start Triamcinolone  0.1% ointment twice daily for rash  C. Get CXR, labs to evaluate for sarcoidosis   6. Return to clinic in 12 months or earlier if problem   Other:     Thank you so much for letting me partake in your care today.  Don't hesitate to reach out if you have any additional concerns!  Orelia Binet, MD  [facility] [place], [place]

## 2024-03-13 LAB — SEDIMENTATION RATE: Sed Rate: 20 mm/h (ref 0–32)

## 2024-03-13 LAB — C-REACTIVE PROTEIN: CRP: 4 mg/L (ref 0–10)

## 2024-03-13 LAB — ANGIOTENSIN CONVERTING ENZYME: Angio Convert Enzyme: 31 U/L (ref 14–82)

## 2024-03-13 NOTE — Progress Notes (Signed)
 Sarcoid labs all returned negative or normal.  This is good news.  Chest XRAY is still pending

## 2024-03-14 ENCOUNTER — Other Ambulatory Visit: Payer: Self-pay

## 2024-03-14 ENCOUNTER — Ambulatory Visit (HOSPITAL_BASED_OUTPATIENT_CLINIC_OR_DEPARTMENT_OTHER)
Admission: RE | Admit: 2024-03-14 | Discharge: 2024-03-14 | Disposition: A | Discharge status: Home / Self Care | Payer: Self-pay | Source: Ambulatory Visit | Attending: Internal Medicine | Admitting: Internal Medicine

## 2024-03-14 ENCOUNTER — Telehealth: Payer: Self-pay | Admitting: Internal Medicine

## 2024-03-14 DIAGNOSIS — L923 Foreign body granuloma of the skin and subcutaneous tissue: Secondary | ICD-10-CM | POA: Insufficient documentation

## 2024-03-14 MED ORDER — TRIAMCINOLONE ACETONIDE 0.1 % EX OINT: 1.0000 | TOPICAL_OINTMENT | Freq: Two times a day (BID) | CUTANEOUS | 1 refills | Status: AC

## 2024-03-14 NOTE — Telephone Encounter (Signed)
 Spoke yo patient per AVS triamcinalone 0.1% sent in bid for rash and patient informed verified pharmacy since there are two on file sent to [facility] in [place]

## 2024-03-14 NOTE — Telephone Encounter (Signed)
 Patient called and stated that she picked up all her prescriptions and that she is missing the Triamcinolone . She states she needs to have this called in ASAP, as it is the main prescription she needed.

## 2024-03-18 ENCOUNTER — Encounter: Payer: Self-pay | Admitting: *Deleted

## 2024-03-18 NOTE — Progress Notes (Signed)
Chest xray was normal.  Can someone let patient know?

## 2024-03-21 ENCOUNTER — Ambulatory Visit (INDEPENDENT_AMBULATORY_CARE_PROVIDER_SITE_OTHER): Payer: Self-pay

## 2024-03-21 DIAGNOSIS — J3089 Other allergic rhinitis: Secondary | ICD-10-CM

## 2024-03-21 DIAGNOSIS — J302 Other seasonal allergic rhinitis: Secondary | ICD-10-CM

## 2024-04-23 ENCOUNTER — Ambulatory Visit (INDEPENDENT_AMBULATORY_CARE_PROVIDER_SITE_OTHER)

## 2024-04-23 DIAGNOSIS — J309 Allergic rhinitis, unspecified: Secondary | ICD-10-CM

## 2024-05-22 ENCOUNTER — Ambulatory Visit (INDEPENDENT_AMBULATORY_CARE_PROVIDER_SITE_OTHER)

## 2024-05-22 DIAGNOSIS — J309 Allergic rhinitis, unspecified: Secondary | ICD-10-CM

## 2024-06-10 DIAGNOSIS — J3081 Allergic rhinitis due to animal (cat) (dog) hair and dander: Secondary | ICD-10-CM | POA: Diagnosis not present

## 2024-06-10 NOTE — Progress Notes (Signed)
 VIALS MADE 06-10-24

## 2024-06-11 DIAGNOSIS — J301 Allergic rhinitis due to pollen: Secondary | ICD-10-CM | POA: Diagnosis not present

## 2024-07-03 ENCOUNTER — Ambulatory Visit (INDEPENDENT_AMBULATORY_CARE_PROVIDER_SITE_OTHER)

## 2024-07-03 DIAGNOSIS — J309 Allergic rhinitis, unspecified: Secondary | ICD-10-CM

## 2024-07-11 ENCOUNTER — Ambulatory Visit (INDEPENDENT_AMBULATORY_CARE_PROVIDER_SITE_OTHER)

## 2024-07-11 DIAGNOSIS — J309 Allergic rhinitis, unspecified: Secondary | ICD-10-CM | POA: Diagnosis not present

## 2024-08-09 ENCOUNTER — Ambulatory Visit (INDEPENDENT_AMBULATORY_CARE_PROVIDER_SITE_OTHER): Payer: Self-pay

## 2024-08-09 DIAGNOSIS — J309 Allergic rhinitis, unspecified: Secondary | ICD-10-CM | POA: Diagnosis not present

## 2024-08-13 ENCOUNTER — Ambulatory Visit (INDEPENDENT_AMBULATORY_CARE_PROVIDER_SITE_OTHER)

## 2024-08-13 DIAGNOSIS — J309 Allergic rhinitis, unspecified: Secondary | ICD-10-CM

## 2024-08-20 ENCOUNTER — Ambulatory Visit (INDEPENDENT_AMBULATORY_CARE_PROVIDER_SITE_OTHER)

## 2024-08-20 DIAGNOSIS — J309 Allergic rhinitis, unspecified: Secondary | ICD-10-CM | POA: Diagnosis not present

## 2024-09-23 ENCOUNTER — Ambulatory Visit

## 2024-09-23 DIAGNOSIS — J309 Allergic rhinitis, unspecified: Secondary | ICD-10-CM

## 2024-10-30 ENCOUNTER — Ambulatory Visit (INDEPENDENT_AMBULATORY_CARE_PROVIDER_SITE_OTHER)

## 2024-10-30 DIAGNOSIS — J309 Allergic rhinitis, unspecified: Secondary | ICD-10-CM

## 2024-12-10 ENCOUNTER — Ambulatory Visit

## 2024-12-10 DIAGNOSIS — J302 Other seasonal allergic rhinitis: Secondary | ICD-10-CM
